# Patient Record
Sex: Female | Born: 1975 | Race: White | Hispanic: No | Marital: Single | Smoking: Former smoker
Health system: Southern US, Community
[De-identification: ages and names within clinical notes are randomized; demographics above are authoritative.]

## PROBLEM LIST (undated history)

## (undated) DIAGNOSIS — R51 Headache: Secondary | ICD-10-CM

## (undated) DIAGNOSIS — T8859XA Other complications of anesthesia, initial encounter: Secondary | ICD-10-CM

## (undated) DIAGNOSIS — T4145XA Adverse effect of unspecified anesthetic, initial encounter: Secondary | ICD-10-CM

## (undated) DIAGNOSIS — J45909 Unspecified asthma, uncomplicated: Secondary | ICD-10-CM

## (undated) DIAGNOSIS — R519 Headache, unspecified: Secondary | ICD-10-CM

## (undated) DIAGNOSIS — A630 Anogenital (venereal) warts: Secondary | ICD-10-CM

## (undated) DIAGNOSIS — Z8619 Personal history of other infectious and parasitic diseases: Secondary | ICD-10-CM

## (undated) DIAGNOSIS — T7840XA Allergy, unspecified, initial encounter: Secondary | ICD-10-CM

## (undated) HISTORY — DX: Allergy, unspecified, initial encounter: T78.40XA

## (undated) HISTORY — DX: Anogenital (venereal) warts: A63.0

## (undated) HISTORY — DX: Unspecified asthma, uncomplicated: J45.909

## (undated) HISTORY — DX: Headache, unspecified: R51.9

## (undated) HISTORY — DX: Personal history of other infectious and parasitic diseases: Z86.19

## (undated) HISTORY — PX: WISDOM TOOTH EXTRACTION: SHX21

## (undated) HISTORY — DX: Headache: R51

---
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
## 2016-09-12 DIAGNOSIS — Z1231 Encounter for screening mammogram for malignant neoplasm of breast: Secondary | ICD-10-CM | POA: Diagnosis not present

## 2016-09-12 DIAGNOSIS — Z01419 Encounter for gynecological examination (general) (routine) without abnormal findings: Secondary | ICD-10-CM | POA: Diagnosis not present

## 2016-09-12 DIAGNOSIS — Z30432 Encounter for removal of intrauterine contraceptive device: Secondary | ICD-10-CM | POA: Diagnosis not present

## 2016-09-12 DIAGNOSIS — Z113 Encounter for screening for infections with a predominantly sexual mode of transmission: Secondary | ICD-10-CM | POA: Diagnosis not present

## 2016-09-12 DIAGNOSIS — Z1151 Encounter for screening for human papillomavirus (HPV): Secondary | ICD-10-CM | POA: Diagnosis not present

## 2016-09-12 DIAGNOSIS — Z6826 Body mass index (BMI) 26.0-26.9, adult: Secondary | ICD-10-CM | POA: Diagnosis not present

## 2017-03-06 DIAGNOSIS — Z3202 Encounter for pregnancy test, result negative: Secondary | ICD-10-CM | POA: Diagnosis not present

## 2017-03-22 DIAGNOSIS — N915 Oligomenorrhea, unspecified: Secondary | ICD-10-CM | POA: Diagnosis not present

## 2017-03-24 DIAGNOSIS — E229 Hyperfunction of pituitary gland, unspecified: Secondary | ICD-10-CM | POA: Diagnosis not present

## 2017-04-11 ENCOUNTER — Encounter: Payer: Self-pay | Admitting: Family Medicine

## 2017-04-11 ENCOUNTER — Ambulatory Visit (INDEPENDENT_AMBULATORY_CARE_PROVIDER_SITE_OTHER): Payer: BLUE CROSS/BLUE SHIELD | Admitting: Family Medicine

## 2017-04-11 VITALS — BP 100/80 | HR 70 | Temp 98.4°F | Ht 62.25 in | Wt 149.7 lb

## 2017-04-11 DIAGNOSIS — Z6827 Body mass index (BMI) 27.0-27.9, adult: Secondary | ICD-10-CM

## 2017-04-11 DIAGNOSIS — D649 Anemia, unspecified: Secondary | ICD-10-CM

## 2017-04-11 DIAGNOSIS — Z0000 Encounter for general adult medical examination without abnormal findings: Secondary | ICD-10-CM

## 2017-04-11 DIAGNOSIS — M222X2 Patellofemoral disorders, left knee: Secondary | ICD-10-CM

## 2017-04-11 DIAGNOSIS — R7989 Other specified abnormal findings of blood chemistry: Secondary | ICD-10-CM

## 2017-04-11 DIAGNOSIS — N938 Other specified abnormal uterine and vaginal bleeding: Secondary | ICD-10-CM

## 2017-04-11 DIAGNOSIS — E229 Hyperfunction of pituitary gland, unspecified: Secondary | ICD-10-CM | POA: Diagnosis not present

## 2017-04-11 NOTE — Patient Instructions (Signed)
BEFORE YOU LEAVE: -labs -PFS exercises -follow up: 2-3 months  -We placed a referral for you as discussed for the MRI and to the endocrinologist. It usually takes about 1-2 weeks to process and schedule this referral. If you have not heard from [place] regarding this appointment in 2 weeks please contact our office.  -Do the exercises for the knee 4 days per week   We recommend the following healthy lifestyle for LIFE: 1) Small portions. Regular healthy meals.  2) Eat a healthy clean diet.   TRY TO EAT: -at least 5-7 servings of low sugar vegetables per day (not corn, potatoes or bananas.) -berries are the best choice if you wish to eat fruit.   -lean meets (fish, chicken or [place] breasts) -vegan proteins for some meals - beans or tofu, whole grains, nuts and seeds -Replace bad fats with good fats - good fats include: fish, nuts and seeds, canola oil, olive oil -small amounts of low fat or non fat dairy -small amounts of100 % whole grains - check the lables  AVOID: -SUGAR, sweets, anything with added sugar, corn syrup or sweeteners -if you must have a sweetener, small amounts of stevia may be best -sweetened beverages -simple starches (rice, bread, potatoes, pasta, chips, etc - small amounts of 100% whole grains are ok) -red meat, pork, butter -fried foods, fast food, processed food, excessive dairy, eggs and coconut.  3)Get at least 150 minutes of sweaty aerobic exercise per week.  4)Reduce stress - consider counseling, meditation and relaxation to balance other aspects of your life.

## 2017-04-11 NOTE — Progress Notes (Signed)
HPI:  Linda Barnett is here to establish care.  Last PCP and physical: has been seeing Dr. Garwin Brothers in gyn for women's health, fertility and DUB  Has the following chronic problems that require follow up and concerns today:  Elevated prolactin: -on labs with Dr. Garwin Brothers for DUB (missed one period, trying to conceive) -reports level was repeated then she was told to take cabergoline -no MRI or other labs except TSH done -she wants to have MRI and figure out options in terms of cause and treatment -other then DUB feels well, has had frequent headaches since childhood- unchanged -no breast discharge, fatigue, palpitations, hot/cold intol or any other symptoms -missed one period then had period -no meds -take MV, drinks alcohol and smokes pot on regular basis -hx tobacco use remotely -not doing high protein diet at time of elevated levels  Elevated BMI: -trying ketogenic diet - started after elevated prolactin  L knee pain and crepitus: -chronic, some in R knee also -active, walks with dog on regular basis -sometime pain L later upper thigh as well -no weakness, numbness, malaise, fevers, giving away, locking  ROS negative for unless reported above: fevers, unintentional weight loss, hearing or vision loss, chest pain, palpitations, struggling to breath, hemoptysis, melena, hematochezia, hematuria, falls, loc, si, thoughts of self harm  Past Medical History:  Diagnosis Date  . Allergy   . Asthma   . Frequent headaches   . Genital warts   . History of chicken pox     No past surgical history on file.  Family History  Problem Relation Age of Onset  . Hypothyroidism Mother   . Alcohol abuse Father   . Arthritis Maternal Grandmother   . Breast cancer Maternal Grandmother   . Diabetes Maternal Grandmother     Social History   Social History  . Marital status: Single    Spouse name: N/A  . Number of children: N/A  . Years of education: N/A   Social History Main  Topics  . Smoking status: Former Research scientist (life sciences)  . Smokeless tobacco: Former Systems developer  . Alcohol use Yes     Comment: 2-3 drinks   . Drug use: Yes    Types: Marijuana     Comment: regular use  . Sexual activity: Yes   Other Topics Concern  . None   Social History Narrative  . None    No current outpatient prescriptions on file.  EXAM:  Vitals:   04/11/17 1503  BP: 100/80  Pulse: 70  Temp: 98.4 F (36.9 C)    Body mass index is 27.16 kg/m.  GENERAL: vitals reviewed and listed above, alert, oriented, appears well hydrated and in no acute distress  HEENT: atraumatic, conjunttiva clear, no obvious abnormalities on inspection of external nose and ears  NECK: no obvious masses on inspection  LUNGS: clear to auscultation bilaterally, no wheezes, rales or rhonchi, good air movement  CV: HRRR, no peripheral edema  MS: moves all extremities without noticeable abnormality, normal gait, J sign and patella crepitus with VM atrophy L > R, normal knee exam otherwise  PSYCH: pleasant and cooperative, no obvious depression or anxiety  ASSESSMENT AND PLAN:  Discussed the following assessment and plan:  Elevated prolactin level (HCC) - Plan: MR Brain W Wo Contrast, Basic metabolic panel, CBC DUB (dysfunctional uterine bleeding) -we discussed possible serious and likely etiologies, workup and treatment, treatment risks and return precautions -after this discussion, Sol opted for MRI and referral to endocrinology , labs per above -  reviewed outside labs and TSH normal, prolactin 32 -advised to stop MV and pot -of course, we advised Jolleen  to return or notify a doctor immediately if symptoms worsen or persist or new concerns arise.  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 -We provided refills for any medications we will prescribe as needed. -We addressed current concerns per orders and patient instructions. -We have asked for records for pertinent exams, studies, vaccines and notes from previous providers. -We have advised patient to follow up per instructions below.   -Patient advised to return or notify a doctor immediately if symptoms worsen or persist or new concerns arise. Also follow up in 2-3 months.  Patient Instructions  BEFORE YOU LEAVE: -labs -PFS exercises -follow up: 2-3 months  -We placed a referral for you as discussed for the MRI and to the endocrinologist. It usually takes about 1-2 weeks to process and schedule this referral. If you have not heard from [place] regarding this appointment in 2 weeks please contact our office.  -Do the exercises for the knee 4 days per week   We recommend the following healthy lifestyle for LIFE: 1) Small portions. Regular healthy meals.  2) Eat a healthy clean diet.   TRY TO EAT: -at least 5-7 servings of low sugar vegetables per day (not corn, potatoes or bananas.) -berries are the best choice if you wish to eat fruit.   -lean meets (fish, chicken or [place] breasts) -vegan proteins for some meals - beans or tofu, whole grains, nuts and seeds -Replace bad fats with good fats - good fats include: fish, nuts and seeds, canola oil, olive oil -small amounts of low fat or non fat dairy -small amounts of100 % whole grains - check the lables  AVOID: -SUGAR, sweets, anything with added sugar, corn syrup or sweeteners -if you must have a sweetener, small amounts of stevia may be best -sweetened beverages -simple starches (rice, bread, potatoes, pasta, chips, etc - small amounts of 100% whole grains are ok) -red meat, pork, butter -fried foods, fast food,  processed food, excessive dairy, eggs and coconut.  3)Get at least 150 minutes of sweaty aerobic exercise per week.  4)Reduce stress - consider counseling, meditation and relaxation to balance other aspects of your life.     Colin Benton R.

## 2017-04-12 LAB — BASIC METABOLIC PANEL
BUN: 17 mg/dL (ref 6–23)
CALCIUM: 9.6 mg/dL (ref 8.4–10.5)
CO2: 26 meq/L (ref 19–32)
Chloride: 102 mEq/L (ref 96–112)
Creatinine, Ser: 0.66 mg/dL (ref 0.40–1.20)
GFR: 103.79 mL/min (ref >60.00)
Glucose, Bld: 85 mg/dL (ref 70–99)
Potassium: 4.1 mEq/L (ref 3.5–5.1)
Sodium: 136 mEq/L (ref 135–145)

## 2017-04-12 LAB — HDL CHOLESTEROL: HDL: 64.8 mg/dL (ref >39.00)

## 2017-04-12 LAB — CHOLESTEROL, TOTAL: CHOLESTEROL: 214 mg/dL — AB (ref 0–200)

## 2017-04-12 LAB — CBC
HCT: 35.7 % — ABNORMAL LOW (ref 36.0–46.0)
Hemoglobin: 11.8 g/dL — ABNORMAL LOW (ref 12.0–15.0)
MCHC: 33.1 g/dL (ref 30.0–36.0)
MCV: 89.5 fl (ref 78.0–100.0)
Platelets: 288 10*3/uL (ref 150.0–400.0)
RBC: 3.99 Mil/uL (ref 3.87–5.11)
RDW: 13.3 % (ref 11.5–15.5)
WBC: 8 10*3/uL (ref 4.0–10.5)

## 2017-04-12 LAB — HEMOGLOBIN A1C: HEMOGLOBIN A1C: 5.8 % (ref 4.6–6.5)

## 2017-04-26 ENCOUNTER — Telehealth: Payer: Self-pay | Admitting: Family Medicine

## 2017-04-26 NOTE — Telephone Encounter (Signed)
Linda Barnett pt returned your call

## 2017-04-27 NOTE — Addendum Note (Signed)
Addended by: Agnes Lawrence on: 04/27/2017 04:33 PM   Modules accepted: Orders

## 2017-04-27 NOTE — Telephone Encounter (Signed)
See results note. 

## 2017-05-01 ENCOUNTER — Ambulatory Visit
Admission: RE | Admit: 2017-05-01 | Discharge: 2017-05-01 | Disposition: A | Discharge status: Home / Self Care | Payer: BLUE CROSS/BLUE SHIELD | Source: Ambulatory Visit | Attending: Family Medicine | Admitting: Family Medicine

## 2017-05-01 DIAGNOSIS — R7989 Other specified abnormal findings of blood chemistry: Secondary | ICD-10-CM

## 2017-05-01 DIAGNOSIS — E221 Hyperprolactinemia: Secondary | ICD-10-CM | POA: Diagnosis not present

## 2017-05-01 DIAGNOSIS — E229 Hyperfunction of pituitary gland, unspecified: Principal | ICD-10-CM

## 2017-05-01 MED ORDER — GADOBENATE DIMEGLUMINE 529 MG/ML IV SOLN
10.0000 mL | Freq: Once | INTRAVENOUS | Status: AC | PRN
  Administered 2017-05-01: 10 mL via INTRAVENOUS

## 2017-05-04 ENCOUNTER — Other Ambulatory Visit: Payer: Self-pay | Admitting: *Deleted

## 2017-05-04 DIAGNOSIS — E229 Hyperfunction of pituitary gland, unspecified: Principal | ICD-10-CM

## 2017-05-04 DIAGNOSIS — R7989 Other specified abnormal findings of blood chemistry: Secondary | ICD-10-CM

## 2017-05-08 NOTE — Progress Notes (Signed)
Patient ID: Linda Barnett, female   DOB: [birth year], 41 y.o.   MRN: [id number]         Referring physician: Dr. Colin Benton  Chief complaint: Menstrual irregularity  History of Present Illness  She has had a missed menstrual cycle in May 2018 prior to her visit  with her gynecologist in June Her prolactin was tested and was increased in 74s Because of her potential desire for pregnancy she was recommended cabergoline  However patient was not wanting to start this and discussed the problem with her PCP She did have a menstrual cycle in June which was relatively lighter than usual but her cycle was normal in July and she is due to have a menstrual cycle and about 2 or 3 days now She normally has menstrual cycles about 27-28 days apart  She does not complain of any milky breast discharge She does not complain of any unusual headaches or change in vision  Prolactin levels:  Done in June: 37.6 and 32 within a few days apart, the second level was done fasting in the morning  No results found for: PROLACTIN   MRI of pituitary gland shows no pituitary abnormality   She is now referred here for further management  She is currently undecided whether she wants to go for a pregnancy considering her age    Allergies as of 05/09/2017   No Known Allergies     Medication List       Accurate as of 05/09/17 11:32 AM. Always use your most recent med list.          cabergoline 0.5 MG tablet Commonly known as:  DOSTINEX   CENTRUM WOMEN PO Take 1 tablet by mouth daily.       Allergies: No Known Allergies  Past Medical History:  Diagnosis Date  . Allergy   . Asthma   . Frequent headaches   . Genital warts   . History of chicken pox     No past surgical history on file.  Family History  Problem Relation Age of Onset  . Hypothyroidism Mother   . Alcohol abuse Father   . Arthritis Maternal Grandmother   . Breast cancer Maternal Grandmother   . Diabetes Maternal  Grandmother     Social History:  reports that she has quit smoking. She has never used smokeless tobacco. She reports that she drinks alcohol. She reports that she uses drugs, including Marijuana.   Review of Systems  Constitutional: Positive for weight gain.       She had gained 10 pounds over the last year  HENT:       She has periodic headaches, not changed recently  Eyes: Negative for blurred vision.  Endocrine: Positive for menstrual changes. Negative for fatigue.  Skin:       She has mild chronic hair loss, nothing significant     EXAM:  BP 122/76   Pulse 71   Ht 5' 2.75" (1.594 m)   Wt 149 lb 3.2 oz (67.7 kg)   SpO2 98%   BMI 26.64 kg/m   Physical Exam  Constitutional: She appears well-developed and well-nourished.  Neck: No thyromegaly present.  Cardiovascular: Normal rate, regular rhythm and normal heart sounds.   Pulmonary/Chest: Breath sounds normal.  Abdominal:  Exam not indicated  Lymphadenopathy:    She has no cervical adenopathy.  Neurological: She displays normal reflexes.  Biceps reflexes are normal  Skin: Skin is warm. No pallor.   No history of thyroid  disease.  TSH done in June was 1.8 from gynecologist  ASSESSMENT:  HYPERPROLACTINEMIA, mild and not associated with a documented pituitary adenoma She apparently had only one missed cycles and a lighter cycles subsequently in June but the last menstrual cycle was normal  Not clear if her hyperprolactinemia was related to only a transient dysfunction of her pituitary gland She does not have any other signs symptoms related to hypopituitarism  PLAN:  Recheck prolactin level today and decide on further management or follow-up Since she is asymptomatic currently she does not need to be on medication  Consultation note sent back to PCP  [facility] 05/09/17

## 2017-05-09 ENCOUNTER — Encounter: Payer: Self-pay | Admitting: Endocrinology

## 2017-05-09 ENCOUNTER — Ambulatory Visit (INDEPENDENT_AMBULATORY_CARE_PROVIDER_SITE_OTHER): Payer: BLUE CROSS/BLUE SHIELD | Admitting: Endocrinology

## 2017-05-09 ENCOUNTER — Ambulatory Visit: Payer: BLUE CROSS/BLUE SHIELD | Admitting: Endocrinology

## 2017-05-09 DIAGNOSIS — E221 Hyperprolactinemia: Secondary | ICD-10-CM | POA: Insufficient documentation

## 2017-05-10 LAB — PROLACTIN: Prolactin: 33.6 ng/mL — ABNORMAL HIGH (ref 4.8–23.3)

## 2017-05-10 NOTE — Progress Notes (Signed)
Please call to let patient know that the prolactin level is about the same at 34.  Will need to consider starting her medication if she is planning a pregnancy otherwise no treatment Follow-up in 6 months

## 2017-06-08 NOTE — Progress Notes (Deleted)
  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  Past Medical History:  Diagnosis Date  . Allergy   . Asthma   . Frequent headaches   . Genital warts   . History of chicken pox     No past surgical history on file.  Family History  Problem Relation Age of Onset  . Hypothyroidism Mother   . Alcohol abuse Father   . Arthritis Maternal Grandmother   . Breast cancer Maternal Grandmother   . Diabetes Maternal Grandmother     Social History   Social History  . Marital status: Single    Spouse name: N/A  . Number of children: N/A  . Years of education: N/A   Social History Main Topics  . Smoking status: Former Research scientist (life sciences)  . Smokeless tobacco: Never Used  . Alcohol use Yes     Comment: 2-3 drinks   . Drug use: Yes    Types: Marijuana     Comment: regular use  . Sexual activity: Yes   Other Topics Concern  . Not on file   Social History Narrative   Work or School: Therapist, sports      Home Situation: lives with partner - female Biochemist, clinical)      Spiritual Beliefs:       Lifestyle: ketogenic diet, walking        Current Outpatient Prescriptions:  .  cabergoline (DOSTINEX) 0.5 MG tablet, , Disp: , Rfl: 0 .  Multiple Vitamins-Minerals (CENTRUM WOMEN PO), Take 1 tablet by mouth daily., Disp: , Rfl:   EXAM:  There were no vitals filed for this visit.  There is no height or weight on file to calculate BMI.  GENERAL: vitals reviewed and listed above, alert, oriented, appears well hydrated and in no acute distress  HEENT: atraumatic, conjunttiva clear, no obvious abnormalities on inspection of external nose and ears  NECK: no obvious masses on inspection  LUNGS: clear to  auscultation bilaterally, no wheezes, rales or rhonchi, good air movement  CV: HRRR, no peripheral edema  MS: moves all extremities without noticeable abnormality  PSYCH: pleasant and cooperative, no obvious depression or anxiety  ASSESSMENT AND PLAN:  Discussed the following assessment and plan:  No diagnosis found.  -Patient advised to return or notify a doctor immediately if symptoms worsen or persist or new concerns arise.  There are no Patient Instructions on file for this visit.  Colin Benton R., DO

## 2017-06-09 ENCOUNTER — Encounter: Payer: Self-pay | Admitting: Family Medicine

## 2017-06-09 ENCOUNTER — Ambulatory Visit (INDEPENDENT_AMBULATORY_CARE_PROVIDER_SITE_OTHER): Payer: BLUE CROSS/BLUE SHIELD | Admitting: Family Medicine

## 2017-06-09 ENCOUNTER — Ambulatory Visit: Payer: BLUE CROSS/BLUE SHIELD | Admitting: Family Medicine

## 2017-06-09 VITALS — BP 100/60 | HR 68 | Temp 98.3°F | Ht 62.25 in | Wt 145.0 lb

## 2017-06-09 DIAGNOSIS — Z23 Encounter for immunization: Secondary | ICD-10-CM

## 2017-06-09 DIAGNOSIS — D649 Anemia, unspecified: Secondary | ICD-10-CM | POA: Insufficient documentation

## 2017-06-09 DIAGNOSIS — E785 Hyperlipidemia, unspecified: Secondary | ICD-10-CM

## 2017-06-09 DIAGNOSIS — Z6826 Body mass index (BMI) 26.0-26.9, adult: Secondary | ICD-10-CM | POA: Diagnosis not present

## 2017-06-09 DIAGNOSIS — R739 Hyperglycemia, unspecified: Secondary | ICD-10-CM | POA: Diagnosis not present

## 2017-06-09 NOTE — Progress Notes (Signed)
  HPI:  Follow up elevated BMI, prediabetes, PFS, dyslipidemia, mild anemia and elevated prolactin. Saw endo and on cabergoline. Doing well. Continues low sugar diet and regular exercise and BMI approaching normal. Knee pain resolved. Getting flu shot today. Will complete stool cards. Denies bleeding, knee pain, complaints.  ROS: See pertinent positives and negatives per HPI.  Past Medical History:  Diagnosis Date  . Allergy   . Asthma   . Frequent headaches   . Genital warts   . History of chicken pox     No past surgical history on file.  Family History  Problem Relation Age of Onset  . Hypothyroidism Mother   . Alcohol abuse Father   . Arthritis Maternal Grandmother   . Breast cancer Maternal Grandmother   . Diabetes Maternal Grandmother     Social History   Social History  . Marital status: Single    Spouse name: N/A  . Number of children: N/A  . Years of education: N/A   Social History Main Topics  . Smoking status: Former Research scientist (life sciences)  . Smokeless tobacco: Never Used  . Alcohol use Yes     Comment: 2-3 drinks   . Drug use: Yes    Types: Marijuana     Comment: regular use  . Sexual activity: Yes   Other Topics Concern  . None   Social History Narrative   Work or School: Engineer, maintenance (IT) Situation: lives with partner - female Biochemist, clinical)      Spiritual Beliefs:       Lifestyle: ketogenic diet, walking        Current Outpatient Prescriptions:  .  cabergoline (DOSTINEX) 0.5 MG tablet, , Disp: , Rfl: 0 .  Multiple Vitamins-Minerals (CENTRUM WOMEN PO), Take 1 tablet by mouth daily., Disp: , Rfl:   EXAM:  Vitals:   06/09/17 0943  BP: 100/60  Pulse: 68  Temp: 98.3 F (36.8 C)    Body mass index is 26.31 kg/m.  GENERAL: vitals reviewed and listed above, alert, oriented, appears well hydrated and in no acute distress  HEENT: atraumatic, conjunttiva clear, no obvious abnormalities on inspection of external nose and ears  NECK: no  obvious masses on inspection  LUNGS: clear to auscultation bilaterally, no wheezes, rales or rhonchi, good air movement  CV: HRRR, no peripheral edema  MS: moves all extremities without noticeable abnormality  PSYCH: pleasant and cooperative, no obvious depression or anxiety  ASSESSMENT AND PLAN:  Discussed the following assessment and plan:  BMI 26.0-26.9,adult  Hyperglycemia  Hyperlipidemia, unspecified hyperlipidemia type  Anemia, unspecified type  -flu shot -continue healthy diet and exercise - congratulated on success -stool cards and repeat cbc in 2 months -Patient advised to return or notify a doctor immediately if symptoms worsen or persist or new concerns arise.  Patient Instructions  BEFORE YOU LEAVE: -flu shot -advise to complete stool cards for anemia, ensure she has the cards -follow up: 1) lab appointment for cbc in 2 months 2)CPE with fasting labs 03/2018       Colin Benton R., DO

## 2017-06-09 NOTE — Patient Instructions (Addendum)
BEFORE YOU LEAVE: -flu shot -advise to complete stool cards for anemia, ensure she has the cards -follow up: 1) lab appointment for cbc in 2 months 2)CPE with fasting labs 03/2018

## 2017-07-14 DIAGNOSIS — S90411A Abrasion, right great toe, initial encounter: Secondary | ICD-10-CM | POA: Diagnosis not present

## 2017-07-14 DIAGNOSIS — Z23 Encounter for immunization: Secondary | ICD-10-CM | POA: Diagnosis not present

## 2017-07-26 DIAGNOSIS — F4321 Adjustment disorder with depressed mood: Secondary | ICD-10-CM | POA: Diagnosis not present

## 2017-08-09 ENCOUNTER — Other Ambulatory Visit (INDEPENDENT_AMBULATORY_CARE_PROVIDER_SITE_OTHER): Payer: BLUE CROSS/BLUE SHIELD

## 2017-08-09 DIAGNOSIS — D649 Anemia, unspecified: Secondary | ICD-10-CM

## 2017-08-09 DIAGNOSIS — F4321 Adjustment disorder with depressed mood: Secondary | ICD-10-CM | POA: Diagnosis not present

## 2017-08-10 LAB — CBC WITH DIFFERENTIAL/PLATELET
BASOS ABS: 0.1 10*3/uL (ref 0.0–0.1)
BASOS PCT: 2 % (ref 0.0–3.0)
EOS ABS: 0.2 10*3/uL (ref 0.0–0.7)
Eosinophils Relative: 2.5 % (ref 0.0–5.0)
HCT: 36.6 % (ref 36.0–46.0)
HEMOGLOBIN: 12 g/dL (ref 12.0–15.0)
Lymphocytes Relative: 32.3 % (ref 12.0–46.0)
Lymphs Abs: 2.2 10*3/uL (ref 0.7–4.0)
MCHC: 32.8 g/dL (ref 30.0–36.0)
MCV: 89 fl (ref 78.0–100.0)
MONO ABS: 0.5 10*3/uL (ref 0.1–1.0)
Monocytes Relative: 7.3 % (ref 3.0–12.0)
NEUTROS PCT: 55.9 % (ref 43.0–77.0)
Neutro Abs: 3.7 10*3/uL (ref 1.4–7.7)
PLATELETS: 286 10*3/uL (ref 150.0–400.0)
RBC: 4.11 Mil/uL (ref 3.87–5.11)
RDW: 14 % (ref 11.5–15.5)
WBC: 6.7 10*3/uL (ref 4.0–10.5)

## 2017-08-24 DIAGNOSIS — F4321 Adjustment disorder with depressed mood: Secondary | ICD-10-CM | POA: Diagnosis not present

## 2017-10-03 DIAGNOSIS — F4321 Adjustment disorder with depressed mood: Secondary | ICD-10-CM | POA: Diagnosis not present

## 2017-10-18 DIAGNOSIS — F4321 Adjustment disorder with depressed mood: Secondary | ICD-10-CM | POA: Diagnosis not present

## 2017-11-08 ENCOUNTER — Ambulatory Visit: Payer: BLUE CROSS/BLUE SHIELD | Admitting: Endocrinology

## 2017-11-09 ENCOUNTER — Encounter: Payer: BLUE CROSS/BLUE SHIELD | Admitting: Endocrinology

## 2017-11-09 ENCOUNTER — Other Ambulatory Visit: Payer: BLUE CROSS/BLUE SHIELD

## 2017-11-09 DIAGNOSIS — E221 Hyperprolactinemia: Secondary | ICD-10-CM

## 2017-11-10 LAB — PROLACTIN: Prolactin: 49.9 ng/mL — ABNORMAL HIGH (ref 4.8–23.3)

## 2017-11-10 NOTE — Progress Notes (Signed)
This encounter was created in error - please disregard.

## 2017-11-13 ENCOUNTER — Ambulatory Visit: Payer: BLUE CROSS/BLUE SHIELD | Admitting: Endocrinology

## 2017-11-13 ENCOUNTER — Encounter: Payer: Self-pay | Admitting: Endocrinology

## 2017-11-13 VITALS — BP 112/71 | HR 81 | Temp 98.1°F | Resp 16 | Ht 63.0 in | Wt 148.0 lb

## 2017-11-13 DIAGNOSIS — E221 Hyperprolactinemia: Secondary | ICD-10-CM | POA: Diagnosis not present

## 2017-11-13 NOTE — Progress Notes (Signed)
Patient ID: Linda Barnett, female   DOB: December 16, 1975, 42 y.o.   MRN: [id number]         Referring physician: Dr. Colin Benton  Chief complaint:    Menstrual irregularity  History of Present Illness  She has had a missed menstrual cycle in May 2018 prior to her visit  with her gynecologist in June Her prolactin was tested and was increased in 36s Because of her potential desire for pregnancy she was recommended cabergoline In 04/2017 However patient was not wanting to start this and discussed the problem with her PCP  She did have a menstrual cycle in June 2018 which was relatively lighter than usual but her cycle was normal in July  Since her last visit her menstrual cycles have been normal in duration and frequency  She normally has menstrual cycles about 27-28 days apart  She does not complain of any milky breast discharge  She does not complain of any new headaches  Prolactin levels:  Done in June 2018: 37.6 and 32 within a few days apart, the second level was done fasting in the morning  Lab Results  Component Value Date   PROLACTIN 49.9 (H) 11/09/2017   PROLACTIN 33.6 (H) 05/09/2017     MRI of pituitary gland in 04/2017 shows no pituitary abnormality   She is still undecided whether she wants to go for a pregnancy    Allergies as of 11/13/2017   No Known Allergies     Medication List        Accurate as of 11/13/17  5:04 PM. Always use your most recent med list.          CENTRUM WOMEN PO Take 1 tablet by mouth daily.       Allergies: No Known Allergies  Past Medical History:  Diagnosis Date  . Allergy   . Asthma   . Frequent headaches   . Genital warts   . History of chicken pox     History reviewed. No pertinent surgical history.  Family History  Problem Relation Age of Onset  . Hypothyroidism Mother   . Alcohol abuse Father   . Arthritis Maternal Grandmother   . Breast cancer Maternal Grandmother   . Diabetes Maternal Grandmother      Social History:  reports that she has quit smoking. she has never used smokeless tobacco. She reports that she drinks alcohol. She reports that she uses drugs. Drug: Marijuana.   Review of Systems   EXAM:  BP 112/71 (BP Location: Left Arm, Patient Position: Sitting, Cuff Size: Small)   Pulse 81   Temp 98.1 F (36.7 C) (Oral)   Resp 16   Ht 5' 3"  (1.6 m)   Wt 148 lb (67.1 kg)   SpO2 99%   BMI 26.22 kg/m   Physical Exam   No history of thyroid disease.    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

## 2017-11-29 DIAGNOSIS — F4321 Adjustment disorder with depressed mood: Secondary | ICD-10-CM | POA: Diagnosis not present

## 2017-12-11 ENCOUNTER — Other Ambulatory Visit: Payer: BLUE CROSS/BLUE SHIELD

## 2017-12-11 DIAGNOSIS — E221 Hyperprolactinemia: Secondary | ICD-10-CM

## 2017-12-12 LAB — PROLACTIN: PROLACTIN: 44.9 ng/mL — AB

## 2017-12-13 DIAGNOSIS — F4321 Adjustment disorder with depressed mood: Secondary | ICD-10-CM | POA: Diagnosis not present

## 2017-12-18 NOTE — Progress Notes (Signed)
Please call to let patient know that the prolactin test is slightly better at 45, if she is having no issues with menstrual cycles no intervention needed

## 2017-12-26 DIAGNOSIS — F4321 Adjustment disorder with depressed mood: Secondary | ICD-10-CM | POA: Diagnosis not present

## 2018-01-10 DIAGNOSIS — F4321 Adjustment disorder with depressed mood: Secondary | ICD-10-CM | POA: Diagnosis not present

## 2018-01-24 DIAGNOSIS — F4321 Adjustment disorder with depressed mood: Secondary | ICD-10-CM | POA: Diagnosis not present

## 2018-02-07 DIAGNOSIS — F4321 Adjustment disorder with depressed mood: Secondary | ICD-10-CM | POA: Diagnosis not present

## 2018-03-01 DIAGNOSIS — F4321 Adjustment disorder with depressed mood: Secondary | ICD-10-CM | POA: Diagnosis not present

## 2018-03-05 ENCOUNTER — Telehealth: Payer: Self-pay | Admitting: Endocrinology

## 2018-03-05 ENCOUNTER — Other Ambulatory Visit: Payer: Self-pay | Admitting: Endocrinology

## 2018-03-05 DIAGNOSIS — E221 Hyperprolactinemia: Secondary | ICD-10-CM

## 2018-03-05 NOTE — Telephone Encounter (Signed)
Patient has reschedule her appt to 6/24 and would like to know if Dr Dwyane Dee needs lab work done prior to visit. I did not see orders in for lab work and wanted to check before scheduling.  Thanks!

## 2018-03-05 NOTE — Telephone Encounter (Signed)
Please advise 

## 2018-03-05 NOTE — Telephone Encounter (Signed)
Could you please call and schedule this?

## 2018-03-05 NOTE — Telephone Encounter (Signed)
She will need fasting labs which are ordered

## 2018-03-13 ENCOUNTER — Ambulatory Visit: Payer: BLUE CROSS/BLUE SHIELD | Admitting: Endocrinology

## 2018-03-16 ENCOUNTER — Other Ambulatory Visit: Payer: BLUE CROSS/BLUE SHIELD

## 2018-03-16 DIAGNOSIS — E221 Hyperprolactinemia: Secondary | ICD-10-CM | POA: Diagnosis not present

## 2018-03-17 LAB — PROLACTIN: Prolactin: 57.1 ng/mL — ABNORMAL HIGH (ref 4.8–23.3)

## 2018-03-18 NOTE — Progress Notes (Signed)
Patient ID: Linda Barnett, female   DOB: 1976/03/12, 42 y.o.   MRN: [id number]           Chief complaint: Follow-up of high prolactin   History of Present Illness  Background history: She had a missed menstrual cycle in May 2018 prior to her visit  with her gynecologist  Her prolactin was tested and was increased in 30s Because of her potential desire for pregnancy she was recommended cabergoline in 04/2017 However patient was not wanting to start this and may have taken only 1 tablet  She did have fairly regular menstrual cycles on her own subsequently  Previous prolactin levels: June 2018: 37.6 and 32 within a few days apart, the second level was done fasting in the morning  Recent history: Her menstrual cycle in June 2019 is late by 10 days, normally about the middle of the month  However she has had regular cycles since her last visit in 2/19 except for being 2 days late last month She normally has menstrual cycles about 27-28 days apart  She does not complain of any milky breast discharge  She does not complain of any new headaches  However her prolactin level which was done from [facility] is higher now  Prolactin levels:   Lab Results  Component Value Date   PROLACTIN 57.1 (H) 03/16/2018   PROLACTIN 44.9 (H) 12/11/2017   PROLACTIN 49.9 (H) 11/09/2017   PROLACTIN 33.6 (H) 05/09/2017     MRI of pituitary gland in 04/2017 shows no pituitary abnormality  No results found for: TSH, FREET4   Allergies as of 03/19/2018   No Known Allergies     Medication List    as of 03/19/2018 10:13 AM   You have not been prescribed any medications.     Allergies: No Known Allergies  Past Medical History:  Diagnosis Date  . Allergy   . Asthma   . Frequent headaches   . Genital warts   . History of chicken pox     History reviewed. No pertinent surgical history.  Family History  Problem Relation Age of Onset  . Hypothyroidism Mother   . Alcohol abuse Father   .  Arthritis Maternal Grandmother   . Breast cancer Maternal Grandmother   . Diabetes Maternal Grandmother     Social History:  reports that she has quit smoking. She has never used smokeless tobacco. She reports that she drinks alcohol. She reports that she has current or past drug history. Drug: Marijuana.   Review of Systems   EXAM:  BP 102/68 (BP Location: Left Arm, Patient Position: Sitting, Cuff Size: Normal)   Pulse 75   Ht 5' 3"  (1.6 m)   Wt 134 lb 12.8 oz (61.1 kg)   SpO2 93%   BMI 23.88 kg/m   Physical Exam   No history of thyroid disease.    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 with no underlying cause or medication use She has had delayed menstrual cycles but mostly this month Currently not desiring pregnancy Discussed effects of persistent prolactin on decreasing estradiol levels especially if her menstrual cycles are not regular  PLAN:  May consider starting cabergoline if she continues to have late menstrual cycles or skips her cycle completely She will call back later this month or early next month to report her progress  Recheck prolactin level in about 3 months and also check for macro-prolactin We will also check estradiol levels as baseline     Elayne Snare 03/19/18

## 2018-03-19 ENCOUNTER — Ambulatory Visit: Payer: BLUE CROSS/BLUE SHIELD | Admitting: Endocrinology

## 2018-03-19 ENCOUNTER — Encounter: Payer: Self-pay | Admitting: Endocrinology

## 2018-03-19 VITALS — BP 102/68 | HR 75 | Ht 63.0 in | Wt 134.8 lb

## 2018-03-19 DIAGNOSIS — E221 Hyperprolactinemia: Secondary | ICD-10-CM | POA: Diagnosis not present

## 2018-03-19 DIAGNOSIS — N914 Secondary oligomenorrhea: Secondary | ICD-10-CM | POA: Diagnosis not present

## 2018-03-19 NOTE — Patient Instructions (Signed)
Call to report in July

## 2018-03-21 DIAGNOSIS — F4321 Adjustment disorder with depressed mood: Secondary | ICD-10-CM | POA: Diagnosis not present

## 2018-04-07 NOTE — Progress Notes (Signed)
HPI:  Using dictation device. Unfortunately this device frequently misinterprets words/phrases.  Here for CPE: Due for tetanus booster.  -Concerns and/or follow up today:   Sees Dr. Dwyane Dee for hyperprolactinemia. MRI in 2018 ok per notes. No medications currently. Sees gyn. Reports is doing well. No complaints today.  Hx mild hyperlipidemia and mild prediabetes. Getting regular exercise and eating healthy.  -Taking folic acid, vitamin D or calcium: no - MV sometimes -Diabetes and Dyslipidemia Screening: fasting for labs -Vaccines: see vaccine section EPIC -pap history: sees gyn, Dr. Garwin Brothers -FDLMP: see nursing notes -sexual activity: yes, female partner, no new partners -wants STI testing (Hep C if born [birth year]): no -FH breast, colon or ovarian ca: see FH Last mammogram: sees Dr. Garwin Brothers for this Last colon cancer screening: n/a Breast Ca Risk Assessment: see family history and pt history DEXA (>/= 59): n/a  -Alcohol, Tobacco, drug use: see social history  Review of Systems - no reported fevers, unintentional weight loss, vision loss, hearing loss, chest pain, sob, hemoptysis, melena, hematochezia, hematuria, genital discharge, changing or concerning skin lesions, bleeding, bruising, loc, thoughts of self harm or SI  Past Medical History:  Diagnosis Date  . Allergy   . Asthma   . Frequent headaches   . Genital warts   . History of chicken pox     History reviewed. No pertinent surgical history.  Family History  Problem Relation Age of Onset  . Hypothyroidism Mother   . Alcohol abuse Father   . Arthritis Maternal Grandmother   . Breast cancer Maternal Grandmother   . Diabetes Maternal Grandmother     Social History   Socioeconomic History  . Marital status: Single    Spouse name: Not on file  . Number of children: Not on file  . Years of education: Not on file  . Highest education level: Not on file  Occupational History  . Not on file  Social Needs  .  Financial resource strain: Not on file  . Food insecurity:    Worry: Not on file    Inability: Not on file  . Transportation needs:    Medical: Not on file    Non-medical: Not on file  Tobacco Use  . Smoking status: Current Some Day Smoker  . Smokeless tobacco: Never Used  Substance and Sexual Activity  . Alcohol use: Yes    Comment: 2-3 drinks   . Drug use: Yes    Types: Marijuana    Comment: regular use  . Sexual activity: Yes  Lifestyle  . Physical activity:    Days per week: Not on file    Minutes per session: Not on file  . Stress: Not on file  Relationships  . Social connections:    Talks on phone: Not on file    Gets together: Not on file    Attends religious service: Not on file    Active member of club or organization: Not on file    Attends meetings of clubs or organizations: Not on file    Relationship status: Not on file  Other Topics Concern  . Not on file  Social History Narrative   Work or School: Therapist, sports      Home Situation: lives with partner - female Biochemist, clinical)      Spiritual Beliefs:       Lifestyle: ketogenic diet, walking    No current outpatient medications on file.  EXAM:  Vitals:   04/09/18 0825  BP: (!) 80/60  Pulse:  86  Temp: 98.1 F (36.7 C)    GENERAL: vitals reviewed and listed below, alert, oriented, appears well hydrated and in no acute distress  HEENT: head atraumatic, PERRLA, normal appearance of eyes, ears, nose and mouth. moist mucus membranes.  NECK: supple, no masses or lymphadenopathy  LUNGS: clear to auscultation bilaterally, no rales, rhonchi or wheeze  CV: HRRR, no peripheral edema or cyanosis, normal pedal pulses  ABDOMEN: bowel sounds normal, soft, non tender to palpation, no masses, no rebound or guarding  GU/BREAST: declined, sees Dr. Garwin Brothers for this  SKIN: no rash or abnormal lesions on exposed portions  MS: normal gait, moves all extremities normally  NEURO: normal gait, speech and  thought processing grossly intact, muscle tone grossly intact throughout  PSYCH: normal affect, pleasant and cooperative  ASSESSMENT AND PLAN:  Discussed the following assessment and plan:  PREVENTIVE EXAM: -Discussed and advised all [place] preventive services health task force level A and B recommendations for age, sex and risks. -Advised at least 150 minutes of exercise per week and a healthy diet with avoidance of (less then 1 serving per week) processed foods, white starches, red meat, fast foods and sweets and consisting of: * 5-9 servings of fresh fruits and vegetables (not corn or potatoes) *nuts and seeds, beans *olives and olive oil *lean meats such as fish and white chicken  *whole grains -labs, studies and vaccines per orders this encounter - HIV antibody  2. Screening for depression -neg  3. Hyperlipidemia, unspecified hyperlipidemia type - Lipid panel  4. Hyperglycemia - Hemoglobin A1c  Patient advised to return to clinic immediately if symptoms worsen or persist or new concerns.  Patient Instructions  BEFORE YOU LEAVE: -labs -follow up: yearly for physical  Schedule mammogram and gyn exam with your gynecologist.  Vit D3 [phone] daily  We have ordered labs or studies at this visit. It can take up to 1-2 weeks for results and processing. IF results require follow up or explanation, we will call you with instructions. Clinically stable results will be released to your [facility]. If you have not heard from [place] or cannot find your results in [facility] in 2 weeks please contact our office at [phone].  If you are not yet signed up for [facility], please consider signing up.   Preventive Care 40-64 Years, Female Preventive care refers to lifestyle choices and visits with your health care provider that can promote health and wellness. What does preventive care include?  A yearly physical exam. This is also called an annual well check.  Dental exams once or twice a  year.  Routine eye exams. Ask your health care provider how often you should have your eyes checked.  Personal lifestyle choices, including: ? Daily care of your teeth and gums. ? Regular physical activity. ? Eating a healthy diet. ? Avoiding tobacco and drug use. ? Limiting alcohol use. ? Practicing safe sex. ? Taking vitamin and mineral supplements as recommended by your health care provider. What happens during an annual well check? The services and screenings done by your health care provider during your annual well check will depend on your age, overall health, lifestyle risk factors, and family history of disease. Counseling Your health care provider may ask you questions about your:  Alcohol use.  Tobacco use.  Drug use.  Emotional well-being.  Home and relationship well-being.  Sexual activity.  Eating habits.  Work and work Statistician.  Method of birth control.  Menstrual cycle.  Pregnancy history.  Screening  You may have the following tests or measurements:  Height, weight, and BMI.  Blood pressure.  Lipid and cholesterol levels. These may be checked every 5 years, or more frequently if you are over 39 years old.  Skin check.  Lung cancer screening. You may have this screening every year starting at age 46 if you have a 30-pack-year history of smoking and currently smoke or have quit within the past 15 years.  Fecal occult blood test (FOBT) of the stool. You may have this test every year starting at age 74.  Flexible sigmoidoscopy or colonoscopy. You may have a sigmoidoscopy every 5 years or a colonoscopy every 10 years starting at age 64.  Hepatitis C blood test.  Hepatitis B blood test.  Sexually transmitted disease (STD) testing.  Diabetes screening. This is done by checking your blood sugar (glucose) after you have not eaten for a while (fasting). You may have this done every 1-3 years.  Mammogram. This may be done every 1-2 years. Talk  to your health care provider about when you should start having regular mammograms. This may depend on whether you have a family history of breast cancer.  BRCA-related cancer screening. This may be done if you have a family history of breast, ovarian, tubal, or peritoneal cancers.  Pelvic exam and Pap test. This may be done every 3 years starting at age 21. Starting at age 49, this may be done every 5 years if you have a Pap test in combination with an HPV test.  Bone density scan. This is done to screen for osteoporosis. You may have this scan if you are at high risk for osteoporosis.  Discuss your test results, treatment options, and if necessary, the need for more tests with your health care provider. Vaccines Your health care provider may recommend certain vaccines, such as:  Influenza vaccine. This is recommended every year.  Tetanus, diphtheria, and acellular pertussis (Tdap, Td) vaccine. You may need a Td booster every 10 years.  Varicella vaccine. You may need this if you have not been vaccinated.  Zoster vaccine. You may need this after age 28.  Measles, mumps, and rubella (MMR) vaccine. You may need at least one dose of MMR if you were born in 1957 or later. You may also need a second dose.  Pneumococcal 13-valent conjugate (PCV13) vaccine. You may need this if you have certain conditions and were not previously vaccinated.  Pneumococcal polysaccharide (PPSV23) vaccine. You may need one or two doses if you smoke cigarettes or if you have certain conditions.  Meningococcal vaccine. You may need this if you have certain conditions.  Hepatitis A vaccine. You may need this if you have certain conditions or if you travel or work in places where you may be exposed to hepatitis A.  Hepatitis B vaccine. You may need this if you have certain conditions or if you travel or work in places where you may be exposed to hepatitis B.  Haemophilus influenzae type b (Hib) vaccine. You may  need this if you have certain conditions.  Talk to your health care provider about which screenings and vaccines you need and how often you need them. This information is not intended to replace advice given to you by your health care provider. Make sure you discuss any questions you have with your health care provider. Document Released: 10/09/2015 Document Revised: 06/01/2016 Document Reviewed: 07/14/2015 Elsevier Interactive Patient Education  [employer].  No follow-ups on file.  Demichael Traum R Dirck Butch, DO   

## 2018-04-09 ENCOUNTER — Ambulatory Visit (INDEPENDENT_AMBULATORY_CARE_PROVIDER_SITE_OTHER): Payer: BLUE CROSS/BLUE SHIELD | Admitting: Family Medicine

## 2018-04-09 ENCOUNTER — Encounter: Payer: Self-pay | Admitting: Family Medicine

## 2018-04-09 VITALS — BP 80/60 | HR 86 | Temp 98.1°F | Ht 63.0 in | Wt 135.2 lb

## 2018-04-09 DIAGNOSIS — Z0000 Encounter for general adult medical examination without abnormal findings: Secondary | ICD-10-CM | POA: Diagnosis not present

## 2018-04-09 DIAGNOSIS — E785 Hyperlipidemia, unspecified: Secondary | ICD-10-CM | POA: Diagnosis not present

## 2018-04-09 DIAGNOSIS — Z1331 Encounter for screening for depression: Secondary | ICD-10-CM | POA: Diagnosis not present

## 2018-04-09 DIAGNOSIS — R739 Hyperglycemia, unspecified: Secondary | ICD-10-CM

## 2018-04-09 LAB — HEMOGLOBIN A1C: HEMOGLOBIN A1C: 5.9 % (ref 4.6–6.5)

## 2018-04-09 LAB — LIPID PANEL
CHOLESTEROL: 235 mg/dL — AB (ref 0–200)
HDL: 70.2 mg/dL (ref >39.00)
LDL CALC: 143 mg/dL — AB (ref 0–99)
NonHDL: 165.03
TRIGLYCERIDES: 108 mg/dL (ref 0.0–149.0)
Total CHOL/HDL Ratio: 3
VLDL: 21.6 mg/dL (ref 0.0–40.0)

## 2018-04-09 NOTE — Patient Instructions (Signed)
BEFORE YOU LEAVE: -labs -follow up: yearly for physical  Schedule mammogram and gyn exam with your gynecologist.  Vit D3 [phone] IU daily  We have ordered labs or studies at this visit. It can take up to 1-2 weeks for results and processing. IF results require follow up or explanation, we will call you with instructions. Clinically stable results will be released to your [facility]. If you have not heard from [place] or cannot find your results in [facility] in 2 weeks please contact our office at [phone].  If you are not yet signed up for [facility], please consider signing up.   Preventive Care 40-64 Years, Female Preventive care refers to lifestyle choices and visits with your health care provider that can promote health and wellness. What does preventive care include?  A yearly physical exam. This is also called an annual well check.  Dental exams once or twice a year.  Routine eye exams. Ask your health care provider how often you should have your eyes checked.  Personal lifestyle choices, including: ? Daily care of your teeth and gums. ? Regular physical activity. ? Eating a healthy diet. ? Avoiding tobacco and drug use. ? Limiting alcohol use. ? Practicing safe sex. ? Taking vitamin and mineral supplements as recommended by your health care provider. What happens during an annual well check? The services and screenings done by your health care provider during your annual well check will depend on your age, overall health, lifestyle risk factors, and family history of disease. Counseling Your health care provider may ask you questions about your:  Alcohol use.  Tobacco use.  Drug use.  Emotional well-being.  Home and relationship well-being.  Sexual activity.  Eating habits.  Work and work Statistician.  Method of birth control.  Menstrual cycle.  Pregnancy history.  Screening You may have the following tests or measurements:  Height, weight, and BMI.  Blood  pressure.  Lipid and cholesterol levels. These may be checked every 5 years, or more frequently if you are over 1 years old.  Skin check.  Lung cancer screening. You may have this screening every year starting at age 51 if you have a 30-pack-year history of smoking and currently smoke or have quit within the past 15 years.  Fecal occult blood test (FOBT) of the stool. You may have this test every year starting at age 44.  Flexible sigmoidoscopy or colonoscopy. You may have a sigmoidoscopy every 5 years or a colonoscopy every 10 years starting at age 79.  Hepatitis C blood test.  Hepatitis B blood test.  Sexually transmitted disease (STD) testing.  Diabetes screening. This is done by checking your blood sugar (glucose) after you have not eaten for a while (fasting). You may have this done every 1-3 years.  Mammogram. This may be done every 1-2 years. Talk to your health care provider about when you should start having regular mammograms. This may depend on whether you have a family history of breast cancer.  BRCA-related cancer screening. This may be done if you have a family history of breast, ovarian, tubal, or peritoneal cancers.  Pelvic exam and Pap test. This may be done every 3 years starting at age 24. Starting at age 31, this may be done every 5 years if you have a Pap test in combination with an HPV test.  Bone density scan. This is done to screen for osteoporosis. You may have this scan if you are at high risk for osteoporosis.  Discuss your test  results, treatment options, and if necessary, the need for more tests with your health care provider. Vaccines Your health care provider may recommend certain vaccines, such as:  Influenza vaccine. This is recommended every year.  Tetanus, diphtheria, and acellular pertussis (Tdap, Td) vaccine. You may need a Td booster every 10 years.  Varicella vaccine. You may need this if you have not been vaccinated.  Zoster vaccine. You  may need this after age 17.  Measles, mumps, and rubella (MMR) vaccine. You may need at least one dose of MMR if you were born in 1957 or later. You may also need a second dose.  Pneumococcal 13-valent conjugate (PCV13) vaccine. You may need this if you have certain conditions and were not previously vaccinated.  Pneumococcal polysaccharide (PPSV23) vaccine. You may need one or two doses if you smoke cigarettes or if you have certain conditions.  Meningococcal vaccine. You may need this if you have certain conditions.  Hepatitis A vaccine. You may need this if you have certain conditions or if you travel or work in places where you may be exposed to hepatitis A.  Hepatitis B vaccine. You may need this if you have certain conditions or if you travel or work in places where you may be exposed to hepatitis B.  Haemophilus influenzae type b (Hib) vaccine. You may need this if you have certain conditions.  Talk to your health care provider about which screenings and vaccines you need and how often you need them. This information is not intended to replace advice given to you by your health care provider. Make sure you discuss any questions you have with your health care provider. Document Released: 10/09/2015 Document Revised: 06/01/2016 Document Reviewed: 07/14/2015 Elsevier Interactive Patient Education  [employer].

## 2018-04-10 LAB — HIV ANTIBODY (ROUTINE TESTING W REFLEX): HIV: NONREACTIVE

## 2018-04-11 DIAGNOSIS — F4321 Adjustment disorder with depressed mood: Secondary | ICD-10-CM | POA: Diagnosis not present

## 2018-05-02 DIAGNOSIS — F4321 Adjustment disorder with depressed mood: Secondary | ICD-10-CM | POA: Diagnosis not present

## 2018-05-30 DIAGNOSIS — F4321 Adjustment disorder with depressed mood: Secondary | ICD-10-CM | POA: Diagnosis not present

## 2018-06-15 ENCOUNTER — Other Ambulatory Visit (INDEPENDENT_AMBULATORY_CARE_PROVIDER_SITE_OTHER): Payer: BLUE CROSS/BLUE SHIELD

## 2018-06-15 DIAGNOSIS — E221 Hyperprolactinemia: Secondary | ICD-10-CM

## 2018-06-15 DIAGNOSIS — N914 Secondary oligomenorrhea: Secondary | ICD-10-CM

## 2018-06-19 ENCOUNTER — Encounter: Payer: Self-pay | Admitting: Endocrinology

## 2018-06-19 ENCOUNTER — Ambulatory Visit: Payer: BLUE CROSS/BLUE SHIELD | Admitting: Endocrinology

## 2018-06-19 VITALS — BP 102/64 | HR 80 | Ht 63.0 in | Wt 132.0 lb

## 2018-06-19 DIAGNOSIS — E221 Hyperprolactinemia: Secondary | ICD-10-CM

## 2018-06-19 NOTE — Progress Notes (Signed)
Patient ID: Linda Barnett, female   DOB: 1976/09/09, 42 y.o.   MRN: [id number]           Chief complaint: Follow-up of high prolactin   History of Present Illness  Background history: She had a missed menstrual cycle in May 2018 prior to her visit  with her gynecologist  Her prolactin was tested and was increased in 30s Because of her potential desire for pregnancy she was recommended cabergoline in 04/2017 However patient was not wanting to start this and may have taken only 1 tablet She did have fairly regular menstrual cycles on her own subsequently  Previous prolactin levels: June 2018: 37.6 and 32 within a few days apart, the second level was done fasting in the morning  Recent history: Her menstrual cycles have been coming every month even though twice since her last visit in June the cycles were about 36 days apart and otherwise 27-28 days apart Her menstrual cycles have lasted the usual duration also She does not notice any milky breast discharge  She does not complain of any headaches  To evaluate her prolactin the macroprolactin has been ordered and results are not available She had an estradiol level done which was drawn the day before her menstrual cycle started and the level was low normal for the luteal phase at 42  MRI of pituitary gland in 04/2017 shows no pituitary abnormality  Prolactin levels:   Lab Results  Component Value Date   PROLACTIN 57.1 (H) 03/16/2018   PROLACTIN 44.9 (H) 12/11/2017   PROLACTIN 49.9 (H) 11/09/2017   PROLACTIN 33.6 (H) 05/09/2017      No results found for: TSH, FREET4   Allergies as of 06/19/2018   No Known Allergies     Medication List    as of 06/19/2018 10:49 AM   You have not been prescribed any medications.     Allergies: No Known Allergies  Past Medical History:  Diagnosis Date  . Allergy   . Asthma   . Frequent headaches   . Genital warts   . History of chicken pox     No past surgical history on  file.  Family History  Problem Relation Age of Onset  . Hypothyroidism Mother   . Alcohol abuse Father   . Arthritis Maternal Grandmother   . Breast cancer Maternal Grandmother   . Diabetes Maternal Grandmother     Social History:  reports that she has been smoking. She has never used smokeless tobacco. She reports that she drinks alcohol. She reports that she has current or past drug history. Drug: Marijuana.   Review of Systems    No history of thyroid disease.    TSH done in June 2018 was 1.8 from gynecologist   Physical Exam    BP 102/64   Pulse 80   Ht 5' 3"  (1.6 m)   Wt 132 lb (59.9 kg)   SpO2 98%   BMI 23.38 kg/m   Exam not indicated  ASSESSMENT:  HYPERPROLACTINEMIA, mild, asymptomatic and not associated with a pituitary adenoma as of 2018  As before she likely has idiopathic hyperprolactinemia with no underlying cause or medication use She has had only occasional slightly delayed menstrual cycles but normal duration  Currently not desiring pregnancy Also her estradiol level is in the approximate normal range for the luteal phase, drawn 1 day before her cycle started  Reassured her that since she does not have missed cycles are low estradiol levels that she should not  have any long-term effects   PLAN:  We will discuss the results of the macro prolactin test when it is available Otherwise she will be back in 6 months unless she starts having missed cycles   Elayne Snare 06/19/18

## 2018-06-20 DIAGNOSIS — F4321 Adjustment disorder with depressed mood: Secondary | ICD-10-CM | POA: Diagnosis not present

## 2018-06-23 LAB — MACROPROLACTIN
MONOMERIC PROLACTIN (ICMA): 43 ng/mL — AB
PROLACTIN, SERUM (ICMA): 51 ng/mL — AB
Percent Macroprolactin: 16 %

## 2018-06-23 LAB — ESTRADIOL: ESTRADIOL: 41.9 pg/mL

## 2018-07-04 DIAGNOSIS — F4321 Adjustment disorder with depressed mood: Secondary | ICD-10-CM | POA: Diagnosis not present

## 2018-07-24 DIAGNOSIS — F4321 Adjustment disorder with depressed mood: Secondary | ICD-10-CM | POA: Diagnosis not present

## 2018-08-14 ENCOUNTER — Ambulatory Visit: Payer: BLUE CROSS/BLUE SHIELD | Admitting: Family Medicine

## 2018-08-14 ENCOUNTER — Encounter: Payer: Self-pay | Admitting: Family Medicine

## 2018-08-14 VITALS — BP 108/80 | HR 65 | Temp 97.9°F | Ht 63.0 in | Wt 136.0 lb

## 2018-08-14 DIAGNOSIS — N912 Amenorrhea, unspecified: Secondary | ICD-10-CM | POA: Diagnosis not present

## 2018-08-14 DIAGNOSIS — R739 Hyperglycemia, unspecified: Secondary | ICD-10-CM | POA: Diagnosis not present

## 2018-08-14 DIAGNOSIS — E785 Hyperlipidemia, unspecified: Secondary | ICD-10-CM

## 2018-08-14 DIAGNOSIS — E221 Hyperprolactinemia: Secondary | ICD-10-CM | POA: Diagnosis not present

## 2018-08-14 LAB — LIPID PANEL
CHOLESTEROL: 197 mg/dL (ref 0–200)
HDL: 84.5 mg/dL (ref >39.00)
LDL Cholesterol: 98 mg/dL (ref 0–99)
NONHDL: 112.41
TRIGLYCERIDES: 72 mg/dL (ref 0.0–149.0)
Total CHOL/HDL Ratio: 2
VLDL: 14.4 mg/dL (ref 0.0–40.0)

## 2018-08-14 LAB — HEMOGLOBIN A1C: HEMOGLOBIN A1C: 5.9 % (ref 4.6–6.5)

## 2018-08-14 LAB — POCT URINE PREGNANCY: PREG TEST UR: NEGATIVE

## 2018-08-14 NOTE — Patient Instructions (Signed)
BEFORE YOU LEAVE: -update alcohol and smoking status -urine pregnancy test -lab -follow up: 4-6 months  Please contact your endocrinologist and gynecologist today about the changes in your menstrual cycles.   We have ordered labs or studies at this visit. It can take up to 1-2 weeks for results and processing. IF results require follow up or explanation, we will call you with instructions. Clinically stable results will be released to your [facility]. If you have not heard from [place] or cannot find your results in [facility] in 2 weeks please contact our office at [phone].  If you are not yet signed up for [facility], please consider signing up.   We recommend the following healthy lifestyle for LIFE: 1) Small portions. But, make sure to get regular (at least 3 per day), healthy meals and small healthy snacks if needed.  2) Eat a healthy clean diet.   TRY TO EAT: -at least 5-7 servings of low sugar, colorful, and nutrient rich vegetables per day (not corn, potatoes or bananas.) -berries are the best choice if you wish to eat fruit (only eat small amounts if trying to reduce weight)  -lean meets (fish, white meat of chicken or [place]) -vegan proteins for some meals - beans or tofu, whole grains, nuts and seeds -Replace bad fats with good fats - good fats include: fish, nuts and seeds, canola oil, olive oil -small amounts of low fat or non fat dairy -small amounts of100 % whole grains - check the lables -drink plenty of water  AVOID: -SUGAR, sweets, anything with added sugar, corn syrup or sweeteners - must read labels as even foods advertised as "healthy" often are loaded with sugar -if you must have a sweetener, small amounts of stevia may be best -sweetened beverages and artificially sweetened beverages -simple starches (rice, bread, potatoes, pasta, chips, etc - small amounts of 100% whole grains are ok) -red meat, pork, butter -fried foods, fast food, processed food, excessive dairy,  eggs and coconut.  3)Get at least 150 minutes of sweaty aerobic exercise per week.  4)Reduce stress - consider counseling, meditation and relaxation to balance other aspects of your life.

## 2018-08-14 NOTE — Progress Notes (Signed)
HPI:  Using dictation device. Unfortunately this device frequently misinterprets words/phrases.  Linda Barnett is a pleasant 42 y.o. here for follow up. Chronic medical problems summarized below were reviewed for changes and stability and were updated as needed below. These issues and their treatment remain stable for the most part.  She is seeing Endo for the hyperprolactinemia. Has not had a period in several months and has some hot flashes initially, now resolved. Has not seen endo or gyn about this. Denies headaches, fevers, malaise, skin changes, wt changes with this. Fasting for lipid check. CPE 04/09/2018 Due for labs  Hyperlipidemia/hyperglycemia: -Lifestyle recommendations advised  Hyperprolactinemia: -Sees Dr. Dwyane Dee, endocrinologist, for management -MRI in 2018 okay per notes  ROS: See pertinent positives and negatives per HPI.  Past Medical History:  Diagnosis Date  . Allergy   . Asthma   . Frequent headaches   . Genital warts   . History of chicken pox     History reviewed. No pertinent surgical history.  Family History  Problem Relation Age of Onset  . Hypothyroidism Mother   . Alcohol abuse Father   . Arthritis Maternal Grandmother   . Breast cancer Maternal Grandmother   . Diabetes Maternal Grandmother     SOCIAL HX: seehpi  No current outpatient medications on file.  EXAM:  Vitals:   08/14/18 0852  BP: 108/80  Pulse: 65  Temp: 97.9 F (36.6 C)    Body mass index is 24.09 kg/m.  GENERAL: vitals reviewed and listed above, alert, oriented, appears well hydrated and in no acute distress  HEENT: atraumatic, conjunttiva clear, no obvious abnormalities on inspection of external nose and ears  NECK: no obvious masses on inspection  LUNGS: clear to auscultation bilaterally, no wheezes, rales or rhonchi, good air movement  CV: HRRR, no peripheral edema  MS: moves all extremities without noticeable abnormality  ABD: soft, NTTP  PSYCH:  pleasant and cooperative, no obvious depression or anxiety  ASSESSMENT AND PLAN:  Discussed the following assessment and plan:  Hyperlipidemia, unspecified hyperlipidemia type  Hyperglycemia  Hyperprolactinemia (HCC)  Amenorrhea  -labs for lipid and blood sugar check -lifestyle recs -upreg test -discussed potential etiologies amenorrhea and eval - opted for follow up with gyn/endo given PMH about this and she agrees to call -Patient advised to return or notify a doctor immediately if symptoms worsen or persist or new concerns arise.  Patient Instructions  BEFORE YOU LEAVE: -update alcohol and smoking status -urine pregnancy test -lab -follow up: 4-6 months  Please contact your endocrinologist and gynecologist today about the changes in your menstrual cycles.   We have ordered labs or studies at this visit. It can take up to 1-2 weeks for results and processing. IF results require follow up or explanation, we will call you with instructions. Clinically stable results will be released to your [facility]. If you have not heard from [place] or cannot find your results in [facility] in 2 weeks please contact our office at [phone].  If you are not yet signed up for [facility], please consider signing up.   We recommend the following healthy lifestyle for LIFE: 1) Small portions. But, make sure to get regular (at least 3 per day), healthy meals and small healthy snacks if needed.  2) Eat a healthy clean diet.   TRY TO EAT: -at least 5-7 servings of low sugar, colorful, and nutrient rich vegetables per day (not corn, potatoes or bananas.) -berries are the best choice if you wish to eat fruit (  only eat small amounts if trying to reduce weight)  -lean meets (fish, white meat of chicken or [place]) -vegan proteins for some meals - beans or tofu, whole grains, nuts and seeds -Replace bad fats with good fats - good fats include: fish, nuts and seeds, canola oil, olive oil -small amounts of low  fat or non fat dairy -small amounts of100 % whole grains - check the lables -drink plenty of water  AVOID: -SUGAR, sweets, anything with added sugar, corn syrup or sweeteners - must read labels as even foods advertised as "healthy" often are loaded with sugar -if you must have a sweetener, small amounts of stevia may be best -sweetened beverages and artificially sweetened beverages -simple starches (rice, bread, potatoes, pasta, chips, etc - small amounts of 100% whole grains are ok) -red meat, pork, butter -fried foods, fast food, processed food, excessive dairy, eggs and coconut.  3)Get at least 150 minutes of sweaty aerobic exercise per week.  4)Reduce stress - consider counseling, meditation and relaxation to balance other aspects of your life.          [name], DO

## 2018-08-14 NOTE — Addendum Note (Signed)
Addended by: Agnes Lawrence on: 08/14/2018 09:28 AM   Modules accepted: Orders

## 2018-08-14 NOTE — Addendum Note (Signed)
Addended by: [name] on: 08/14/2018 09:37 AM   Modules accepted: Orders

## 2018-09-07 DIAGNOSIS — F4321 Adjustment disorder with depressed mood: Secondary | ICD-10-CM | POA: Diagnosis not present

## 2018-09-26 NOTE — L&D Delivery Note (Signed)
Delivery Note  First Stage - 10/24 midnight to 10/26 labor mode: induction due to oligiohydramnios cytotec x 1 10/24 ~0200 Cervical balloon 10/24 AM ~0930 labor (contractions) onset: 10/24 @2000  with AROM AROM at 2000 on 10/24 Pitocin augmentation at 1400 10/25 analgesia /anesthesia intrapartum: epidural AM of 10/26  Second Stage - 07/22/2019:  complete dilation at 0652 onset of pushing at 0700 FHR second stage category 2  delivery of a viable female at 1021 by CNM in ROA position tight nuchal cord x 1 wrapped around neck and bilateral compound presentation of hands at neck reduced down body then hands freed from cord and cord slid down body for birth of body  newborn initially limp but responding to stimulation - first cry just at 1 minute - placed on maternal abdomen after first cry - cord clamped and cut then moved to warmer for additional assessment  cord double clamped after cessation of pulsation, cut by FOB cord blood sample collected  arterial cord blood sample collected and sent  Third Stage: placenta delivered shultz intact with 3 VC @ 1028 placenta disposition: hospital disposal uterine tone firm / bleeding small complications: none  no lacerations identified  estimated blood loss (mL): 2  mom to postpartum / baby to Couplet care / Skin to Skin.  Newborn: Apgar Scores: 1-minute: 6                           5-minute: 9   feeding plan: breastfeeding birth weight: pending  Linda Barnett CNM, MSN, [facility] 07/22/2019, 10:43 AM

## 2018-10-03 DIAGNOSIS — F4321 Adjustment disorder with depressed mood: Secondary | ICD-10-CM | POA: Diagnosis not present

## 2018-10-31 DIAGNOSIS — F4321 Adjustment disorder with depressed mood: Secondary | ICD-10-CM | POA: Diagnosis not present

## 2018-11-21 DIAGNOSIS — F4321 Adjustment disorder with depressed mood: Secondary | ICD-10-CM | POA: Diagnosis not present

## 2018-12-12 ENCOUNTER — Telehealth: Payer: Self-pay | Admitting: Family Medicine

## 2018-12-12 ENCOUNTER — Ambulatory Visit (INDEPENDENT_AMBULATORY_CARE_PROVIDER_SITE_OTHER): Payer: BLUE CROSS/BLUE SHIELD | Admitting: Adult Health

## 2018-12-12 ENCOUNTER — Other Ambulatory Visit: Payer: Self-pay

## 2018-12-12 ENCOUNTER — Encounter: Payer: Self-pay | Admitting: Adult Health

## 2018-12-12 VITALS — HR 85 | Temp 98.1°F | Resp 16

## 2018-12-12 DIAGNOSIS — J302 Other seasonal allergic rhinitis: Secondary | ICD-10-CM

## 2018-12-12 DIAGNOSIS — F4321 Adjustment disorder with depressed mood: Secondary | ICD-10-CM | POA: Diagnosis not present

## 2018-12-12 NOTE — Telephone Encounter (Signed)
LM to call back for CV PreScreen

## 2018-12-12 NOTE — Progress Notes (Signed)
Subjective:    Patient ID: Linda Barnett, female    DOB: Apr 22, 1976, 43 y.o.   MRN: [id number]  HPI  43 year old female who  has a past medical history of Allergy, Asthma, Frequent headaches, Genital warts, and History of chicken pox.  She presents to clinic today for an acute complaint.  Symptoms include that of sinus congestion, semi productive cough, chills, and feeling run down. Symptoms have been present for 3-4 days.   She denies fevers, SOB, or wheezing.   She has seasonal allergies and takes Claritin, she missed her treatment over the weekend    Review of Systems See HPI   Past Medical History:  Diagnosis Date  . Allergy   . Asthma   . Frequent headaches   . Genital warts   . History of chicken pox     Social History   Socioeconomic History  . Marital status: Single    Spouse name: Not on file  . Number of children: Not on file  . Years of education: Not on file  . Highest education level: Not on file  Occupational History  . Not on file  Social Needs  . Financial resource strain: Not on file  . Food insecurity:    Worry: Not on file    Inability: Not on file  . Transportation needs:    Medical: Not on file    Non-medical: Not on file  Tobacco Use  . Smoking status: Former Smoker    Last attempt to quit: 02/11/2018    Years since quitting: 0.8  . Smokeless tobacco: Never Used  Substance and Sexual Activity  . Alcohol use: Yes    Comment: 2-3 drinks   . Drug use: Yes    Types: Marijuana    Comment: regular use  . Sexual activity: Yes  Lifestyle  . Physical activity:    Days per week: Not on file    Minutes per session: Not on file  . Stress: Not on file  Relationships  . Social connections:    Talks on phone: Not on file    Gets together: Not on file    Attends religious service: Not on file    Active member of club or organization: Not on file    Attends meetings of clubs or organizations: Not on file    Relationship status: Not on file   . Intimate partner violence:    Fear of current or ex partner: Not on file    Emotionally abused: Not on file    Physically abused: Not on file    Forced sexual activity: Not on file  Other Topics Concern  . Not on file  Social History Narrative   Work or School: Therapist, sports      Home Situation: lives with partner - female Biochemist, clinical)      Spiritual Beliefs:       Lifestyle: ketogenic diet, walking    No past surgical history on file.  Family History  Problem Relation Age of Onset  . Hypothyroidism Mother   . Alcohol abuse Father   . Arthritis Maternal Grandmother   . Breast cancer Maternal Grandmother   . Diabetes Maternal Grandmother     No Known Allergies  No current outpatient medications on file prior to visit.   No current facility-administered medications on file prior to visit.     Pulse 85   Temp 98.1 F (36.7 C)   Resp 16  Objective:   Physical Exam Vitals signs reviewed.  HENT:     Nose: Nose normal. No congestion or rhinorrhea.     Right Turbinates: Enlarged.     Left Turbinates: Enlarged.     Mouth/Throat:     Mouth: Mucous membranes are moist.     Pharynx: Oropharynx is clear. No oropharyngeal exudate or posterior oropharyngeal erythema.  Cardiovascular:     Rate and Rhythm: Normal rate and regular rhythm.     Pulses: Normal pulses.     Heart sounds: Normal heart sounds.  Pulmonary:     Effort: Pulmonary effort is normal.     Breath sounds: Normal breath sounds.  Skin:    General: Skin is warm and dry.  Neurological:     General: No focal deficit present.     Mental Status: She is alert and oriented to person, place, and time.       Assessment & Plan:  1. Seasonal allergies No red flags for Covid 19 and does not meet criteria for testing. Exam presents as seasonal allergies. Restart Claritin  - Return precautions reviewed   Dorothyann Peng, NP

## 2018-12-12 NOTE — Telephone Encounter (Signed)
Questions for Screening COVID-19 Run-down Slight cough, occasional, wet but not productive, possible pnd from allergies Chills No fever + seasonal allergies  Symptom onset: x2d  Travel or Contacts: no travel  During this illness, did/does the patient experience any of the following symptoms? Fever >100.65F []   Yes [x]   No []   Unknown Subjective fever (felt feverish) []   Yes [x]   No []   Unknown Chills [x]   Yes []   No []   Unknown Muscle aches (myalgia) []   Yes [x]   No []   Unknown Runny nose (rhinorrhea) []   Yes [x]   No []   Unknown Sore throat []   Yes []   No [x]   Unknown Cough (new onset or worsening of chronic cough) [x]   Yes []   No []   Unknown Shortness of breath (dyspnea) []   Yes [x]   No []   Unknown Nausea or vomiting []   Yes []   No []   Unknown Headache []   Yes [x]   No []   Unknown Abdominal pain  []   Yes [x]   No []   Unknown Diarrhea (?3 loose/looser than normal stools/24hr period) []   Yes [x]   No []   Unknown Other, specify:_____________________________________________   Patient risk factors: Smoker? [x]   Current []   Former []   Never If female, currently pregnant? []   Yes [x]   No  Patient Active Problem List   Diagnosis Date Noted  . Anemia 06/09/2017  . Hyperlipidemia 06/09/2017  . Hyperglycemia 06/09/2017  . Hyperprolactinemia ([place]) 05/09/2017    Plan:  []   High risk for COVID-19 with red flags go to ED (with CP, SOB, weak/lightheaded, or fever > 101.5). Call ahead.  []   High risk for COVID-19 but stable will have car visit. Inform provider and coordinate time. Will be completed in afternoon. [x]   No red flags but URI signs or symptoms will go through side door and be seen in dedicated room.   Note: Referral to telemedicine is an appropriate alternative disposition for higher risk but stable. [facility]/e-Visit: [phone].

## 2018-12-13 ENCOUNTER — Other Ambulatory Visit: Payer: Self-pay | Admitting: Endocrinology

## 2018-12-13 ENCOUNTER — Other Ambulatory Visit: Payer: BLUE CROSS/BLUE SHIELD

## 2018-12-13 DIAGNOSIS — N926 Irregular menstruation, unspecified: Secondary | ICD-10-CM

## 2018-12-13 DIAGNOSIS — E221 Hyperprolactinemia: Secondary | ICD-10-CM | POA: Diagnosis not present

## 2018-12-14 LAB — PREGNANCY, URINE: Preg Test, Ur: POSITIVE — AB

## 2018-12-14 LAB — ESTRADIOL, FREE

## 2018-12-17 LAB — PROLACTIN: Prolactin: 95.1 ng/mL — ABNORMAL HIGH (ref 4.8–23.3)

## 2018-12-18 ENCOUNTER — Ambulatory Visit: Payer: BLUE CROSS/BLUE SHIELD | Admitting: Endocrinology

## 2018-12-18 ENCOUNTER — Other Ambulatory Visit: Payer: Self-pay

## 2018-12-18 ENCOUNTER — Encounter: Payer: Self-pay | Admitting: Endocrinology

## 2018-12-18 VITALS — BP 108/70 | HR 64 | Temp 98.6°F | Ht 63.0 in | Wt 138.6 lb

## 2018-12-18 DIAGNOSIS — E221 Hyperprolactinemia: Secondary | ICD-10-CM

## 2018-12-18 NOTE — Progress Notes (Signed)
Patient ID: Linda Barnett, female   DOB: 1976/09/11, 43 y.o.   MRN: [id number]           Chief complaint: Follow-up of high prolactin   History of Present Illness  Background history: She had a missed menstrual cycle in May 2018 prior to her visit  with her gynecologist  Her prolactin was tested and was increased in 30s Because of her potential desire for pregnancy she was recommended cabergoline in 04/2017 However patient was not wanting to start this and may have taken only 1 tablet She did have fairly regular menstrual cycles on her own subsequently  Previous prolactin levels: June 2018: 37.6 and 32 within a few days apart, the second level was done fasting in the morning  Recent history:  She was just diagnosed to have early pregnancy and her last menstrual cycle was 57 days ago As expected her prolactin level has increased with the pregnancy She does not notice any milky breast discharge  She does not complain of any headaches  To evaluate her prolactin the macroprolactin was evaluated and was found to be only 16% She had an estradiol level done which was drawn the day before her menstrual cycle started and the level was low normal for the luteal phase at 42  MRI of pituitary gland in 04/2017 shows no pituitary abnormality  Prolactin levels:   Lab Results  Component Value Date   PROLACTIN 95.1 (H) 12/13/2018   PROLACTIN 57.1 (H) 03/16/2018   PROLACTIN 44.9 (H) 12/11/2017   PROLACTIN 49.9 (H) 11/09/2017      No results found for: TSH, FREET4   Allergies as of 12/18/2018   No Known Allergies     Medication List    as of December 18, 2018  8:47 AM   You have not been prescribed any medications.     Allergies: No Known Allergies  Past Medical History:  Diagnosis Date  . Allergy   . Asthma   . Frequent headaches   . Genital warts   . History of chicken pox     History reviewed. No pertinent surgical history.  Family History  Problem Relation Age of  Onset  . Hypothyroidism Mother   . Alcohol abuse Father   . Arthritis Maternal Grandmother   . Breast cancer Maternal Grandmother   . Diabetes Maternal Grandmother     Social History:  reports that she quit smoking about 10 months ago. She has never used smokeless tobacco. She reports current alcohol use. She reports current drug use. Drug: Marijuana.   Review of Systems    No history of thyroid disease.    TSH done in June 2018 was 1.8 from gynecologist   Physical Exam    BP 108/70 (BP Location: Left Arm, Patient Position: Sitting, Cuff Size: Normal)   Pulse 64   Temp 98.6 F (37 C) (Oral)   Ht 5' 3"  (1.6 m)   Wt 138 lb 9.6 oz (62.9 kg)   SpO2 98%   BMI 24.55 kg/m    Exam not indicated  ASSESSMENT:  HYPERPROLACTINEMIA, mild, asymptomatic and not associated with a pituitary adenoma as of 2018  She has had idiopathic hyperprolactinemia with no underlying cause or medication use and no evidence of macro prolactinemia She has had only occasional slightly delayed menstrual cycles in the past and no galactorrhea  With her being pregnant currently prolactin is higher as expected However since she does not have a pituitary adenoma her hyperprolactinemia should not be an  issue during pregnancy   PLAN:  She is going to establish with a obstetrician She will follow-up in about a year unless she has any new symptoms or issues   Elayne Snare 12/18/18

## 2018-12-19 DIAGNOSIS — Z3201 Encounter for pregnancy test, result positive: Secondary | ICD-10-CM | POA: Diagnosis not present

## 2018-12-19 DIAGNOSIS — F4321 Adjustment disorder with depressed mood: Secondary | ICD-10-CM | POA: Diagnosis not present

## 2018-12-20 ENCOUNTER — Other Ambulatory Visit: Payer: Self-pay | Admitting: Obstetrics and Gynecology

## 2018-12-20 ENCOUNTER — Ambulatory Visit: Payer: BLUE CROSS/BLUE SHIELD | Admitting: Family Medicine

## 2018-12-31 DIAGNOSIS — Z3400 Encounter for supervision of normal first pregnancy, unspecified trimester: Secondary | ICD-10-CM | POA: Diagnosis not present

## 2018-12-31 LAB — OB RESULTS CONSOLE ANTIBODY SCREEN: Antibody Screen: NEGATIVE

## 2018-12-31 LAB — OB RESULTS CONSOLE HEPATITIS B SURFACE ANTIGEN: Hepatitis B Surface Ag: NEGATIVE

## 2018-12-31 LAB — OB RESULTS CONSOLE RUBELLA ANTIBODY, IGM: Rubella: IMMUNE

## 2018-12-31 LAB — OB RESULTS CONSOLE HIV ANTIBODY (ROUTINE TESTING): HIV: NONREACTIVE

## 2018-12-31 LAB — OB RESULTS CONSOLE ABO/RH: RH Type: NEGATIVE

## 2018-12-31 LAB — OB RESULTS CONSOLE GC/CHLAMYDIA
Chlamydia: NEGATIVE
Gonorrhea: NEGATIVE

## 2018-12-31 LAB — OB RESULTS CONSOLE RPR: RPR: NONREACTIVE

## 2019-01-02 DIAGNOSIS — F4321 Adjustment disorder with depressed mood: Secondary | ICD-10-CM | POA: Diagnosis not present

## 2019-01-03 DIAGNOSIS — Z3A10 10 weeks gestation of pregnancy: Secondary | ICD-10-CM | POA: Diagnosis not present

## 2019-01-03 DIAGNOSIS — Z124 Encounter for screening for malignant neoplasm of cervix: Secondary | ICD-10-CM | POA: Diagnosis not present

## 2019-01-03 DIAGNOSIS — Z118 Encounter for screening for other infectious and parasitic diseases: Secondary | ICD-10-CM | POA: Diagnosis not present

## 2019-01-03 DIAGNOSIS — O09511 Supervision of elderly primigravida, first trimester: Secondary | ICD-10-CM | POA: Diagnosis not present

## 2019-01-03 DIAGNOSIS — Z113 Encounter for screening for infections with a predominantly sexual mode of transmission: Secondary | ICD-10-CM | POA: Diagnosis not present

## 2019-01-03 DIAGNOSIS — Z3689 Encounter for other specified antenatal screening: Secondary | ICD-10-CM | POA: Diagnosis not present

## 2019-01-03 DIAGNOSIS — Z1151 Encounter for screening for human papillomavirus (HPV): Secondary | ICD-10-CM | POA: Diagnosis not present

## 2019-01-03 DIAGNOSIS — Z36 Encounter for antenatal screening for chromosomal anomalies: Secondary | ICD-10-CM | POA: Diagnosis not present

## 2019-01-03 DIAGNOSIS — B373 Candidiasis of vulva and vagina: Secondary | ICD-10-CM | POA: Diagnosis not present

## 2019-01-14 ENCOUNTER — Encounter: Payer: Self-pay | Admitting: Family Medicine

## 2019-01-14 ENCOUNTER — Ambulatory Visit (INDEPENDENT_AMBULATORY_CARE_PROVIDER_SITE_OTHER): Payer: BLUE CROSS/BLUE SHIELD | Admitting: Family Medicine

## 2019-01-14 ENCOUNTER — Other Ambulatory Visit: Payer: Self-pay

## 2019-01-14 DIAGNOSIS — E785 Hyperlipidemia, unspecified: Secondary | ICD-10-CM

## 2019-01-14 DIAGNOSIS — R739 Hyperglycemia, unspecified: Secondary | ICD-10-CM | POA: Diagnosis not present

## 2019-01-14 DIAGNOSIS — E221 Hyperprolactinemia: Secondary | ICD-10-CM

## 2019-01-14 DIAGNOSIS — Z3A12 12 weeks gestation of pregnancy: Secondary | ICD-10-CM

## 2019-01-14 NOTE — Progress Notes (Signed)
Virtual Visit via Video Note  I connected with Linda Barnett  on 01/14/19 at  8:45 AM EDT by a video enabled telemedicine application and verified that I am speaking with the correct person using two identifiers.  Location patient: home Location provider:work or home office Persons participating in the virtual visit: patient, provider  I discussed the limitations of evaluation and management by telemedicine and the availability of in person appointments. The patient expressed understanding and agreed to proceed.   HPI:  Linda Barnett is a pleasant 43 y.o. here for follow up. Chronic medical problems summarized below were reviewed for changes and stability and were updated as needed below. These issues and their treatment remain stable for the most part.  She had been having some irregular menses and found out she is pregnant. She is [redacted] weeks pregnant! She has been feeling a little nauseous - but now doing better. Her labs were done in November for the blood sugar and the cholesterol and looked good. Denies CP, SOB, DOE, treatment intolerance or new symptoms. She is working from home.   CPE 04/09/2018  Hyperlipidemia/hyperglycemia: -Lifestyle recommendations advised -has been eating ok - but maybe a little more sugar and carbs -she is exercising - doing cough to 5k  Hyperprolactinemia: -Sees Dr. Dwyane Dee, endocrinologist, for management - was seen recently in March -MRI in 2018 okay per notes  ROS: See pertinent positives and negatives per HPI.  Past Medical History:  Diagnosis Date  . Allergy   . Asthma   . Frequent headaches   . Genital warts   . History of chicken pox     History reviewed. No pertinent surgical history.  Family History  Problem Relation Age of Onset  . Hypothyroidism Mother   . Alcohol abuse Father   . Arthritis Maternal Grandmother   . Breast cancer Maternal Grandmother   . Diabetes Maternal Grandmother     SOCIAL HX:    Current Outpatient Medications:   .  Prenatal Vit-Fe Fumarate-FA (PRENATAL VITAMIN PO), Take by mouth daily., Disp: , Rfl:  .  Pyridoxine HCl (VITAMIN B-6 PO), Take by mouth daily., Disp: , Rfl:   EXAM:  VITALS per patient if applicable:  GENERAL: alert, oriented, appears well and in no acute distress  HEENT: atraumatic, conjunttiva clear, no obvious abnormalities on inspection of external nose and ears  NECK: normal movements of the head and neck  LUNGS: on inspection no signs of respiratory distress, breathing rate appears normal, no obvious gross SOB, gasping or wheezing  CV: no obvious cyanosis  MS: moves all visible extremities without noticeable abnormality  PSYCH/NEURO: pleasant and cooperative, no obvious depression or anxiety, speech and thought processing grossly intact  ASSESSMENT AND PLAN:  Discussed the following assessment and plan:  Hyperglycemia  Hyperlipidemia, unspecified hyperlipidemia type  Hyperprolactinemia (HCC)  [redacted] weeks gestation of pregnancy  Congratulated on pregnancy.  Counseled on lifestyle and advised a healthy low sugar diet and regular gentle exercise. Reviewed prior labs and opted to postpone labs today given COVID19 pandemic and seeing ob.  Discussed social distancing in [place] pandemic.  Continue care with Ob and endocrine.  TOC vist with Dr. Ethlyn Gallery (she already has appt) and she is aware I will not be doing in clinic medicine after May 1st.    I discussed the assessment and treatment plan with the patient. The patient was provided an opportunity to ask questions and all were answered. The patient agreed with the plan and demonstrated an understanding of the instructions.  The patient was advised to call back or seek an in-person evaluation if the symptoms worsen or if the condition fails to improve as anticipated.   [name], DO

## 2019-01-16 DIAGNOSIS — F4321 Adjustment disorder with depressed mood: Secondary | ICD-10-CM | POA: Diagnosis not present

## 2019-01-22 DIAGNOSIS — Z3A13 13 weeks gestation of pregnancy: Secondary | ICD-10-CM | POA: Diagnosis not present

## 2019-01-22 DIAGNOSIS — Z3682 Encounter for antenatal screening for nuchal translucency: Secondary | ICD-10-CM | POA: Diagnosis not present

## 2019-01-22 DIAGNOSIS — O09511 Supervision of elderly primigravida, first trimester: Secondary | ICD-10-CM | POA: Diagnosis not present

## 2019-01-30 DIAGNOSIS — F4321 Adjustment disorder with depressed mood: Secondary | ICD-10-CM | POA: Diagnosis not present

## 2019-02-13 DIAGNOSIS — Z361 Encounter for antenatal screening for raised alphafetoprotein level: Secondary | ICD-10-CM | POA: Diagnosis not present

## 2019-02-13 DIAGNOSIS — O09512 Supervision of elderly primigravida, second trimester: Secondary | ICD-10-CM | POA: Diagnosis not present

## 2019-02-13 DIAGNOSIS — Z3A16 16 weeks gestation of pregnancy: Secondary | ICD-10-CM | POA: Diagnosis not present

## 2019-02-20 DIAGNOSIS — F4321 Adjustment disorder with depressed mood: Secondary | ICD-10-CM | POA: Diagnosis not present

## 2019-03-05 DIAGNOSIS — Z3A19 19 weeks gestation of pregnancy: Secondary | ICD-10-CM | POA: Diagnosis not present

## 2019-03-05 DIAGNOSIS — O09512 Supervision of elderly primigravida, second trimester: Secondary | ICD-10-CM | POA: Diagnosis not present

## 2019-03-09 IMAGING — MR MR HEAD WO/W CM
15 of 19 series · 35 of 48 positions shown · IV contrast (multihance)
Comparison: None.

CLINICAL DATA: Elevated prolactin level

EXAM:
MRI HEAD WITHOUT AND WITH CONTRAST
TECHNIQUE: Multiplanar, multiecho pulse sequences of the brain and surrounding
structures were obtained without and with intravenous contrast.
CONTRAST:  10mL MULTIHANCE GADOBENATE DIMEGLUMINE 529 MG/ML IV SOLN

[Series 2: T1 · sagittal · 5.0mm · 0.45mm/px · 3 of 19 slices shown]
[im 1/19]
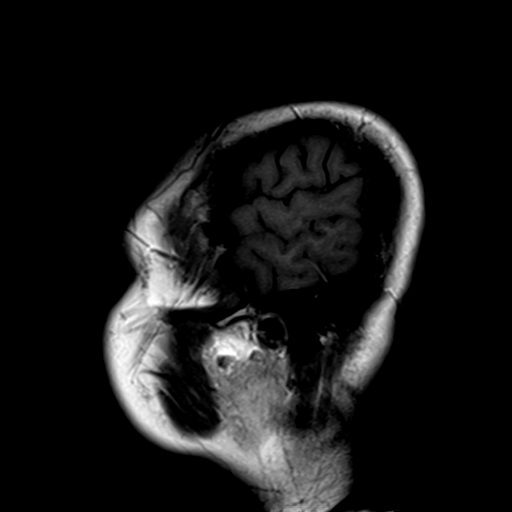
[im 10/19]
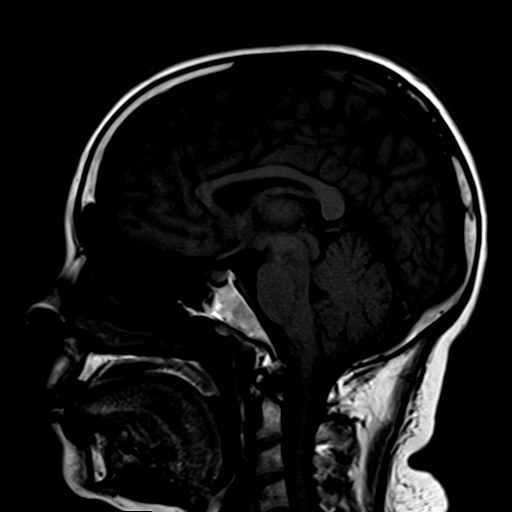
[im 19/19]
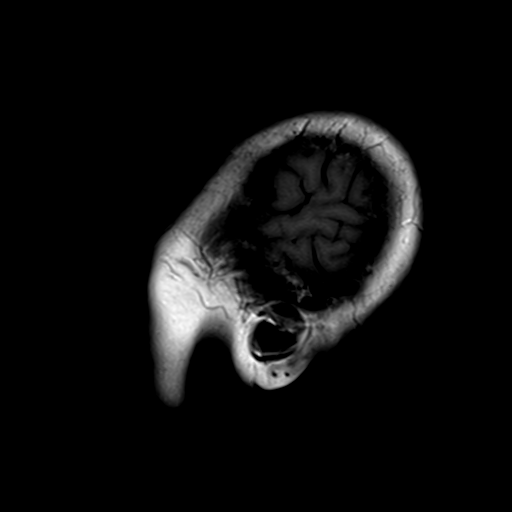

[Series 3: DWI · axial · 3.0mm · 1.80mm/px · z∈[-33,+114]mm · 11 of 100 slices shown]
[im 1/100]
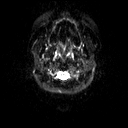
[im 10/100]
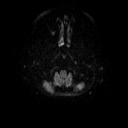
[im 20/100]
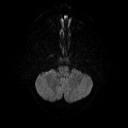
[im 30/100]
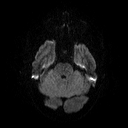
[im 40/100]
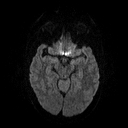
[im 50/100]
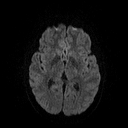
[im 60/100]
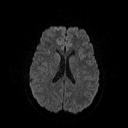
[im 70/100]
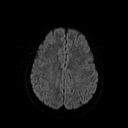
[im 80/100]
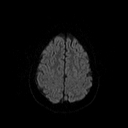
[im 90/100]
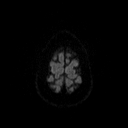
[im 100/100]
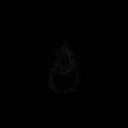

[Series 4: dwi_adc · axial · 3.0mm · 1.80mm/px · z∈[-33,+114]mm · 5 of 49 slices shown]
[im 1/49]
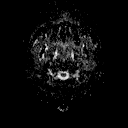
[im 13/49]
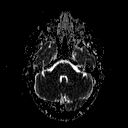
[im 25/49]
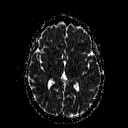
[im 37/49]
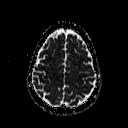
[im 49/49]
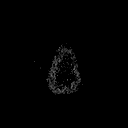

[Series 5: T2 · axial · 5.0mm · 0.36mm/px · z∈[-26,+110]mm · 2 of 22 slices shown]
[im 1/22]
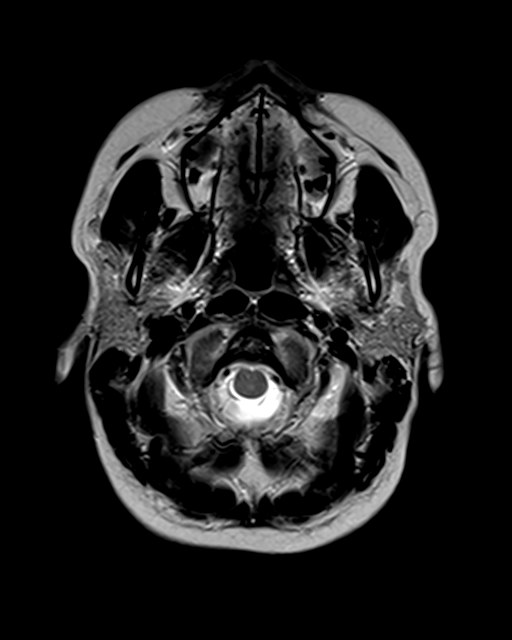
[im 22/22]
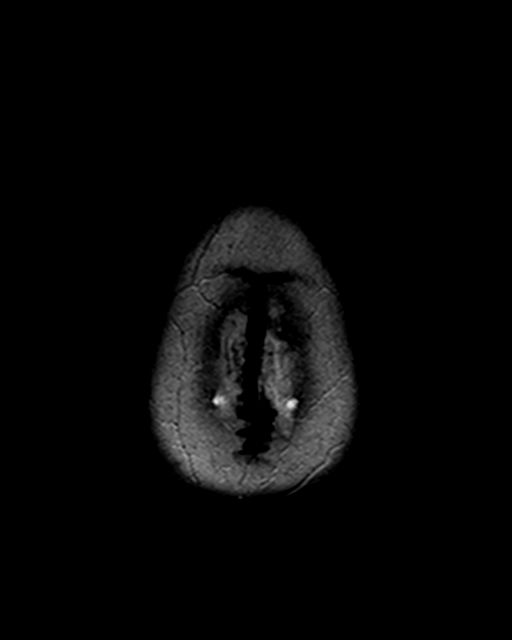

[Series 6: FLAIR · axial · 3.0mm · 0.45mm/px · z∈[-27,+108]mm · 3 of 30 slices shown]
[im 1/30]
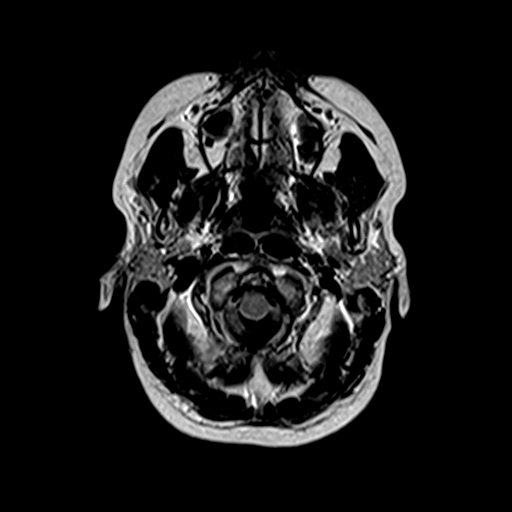
[im 15/30]
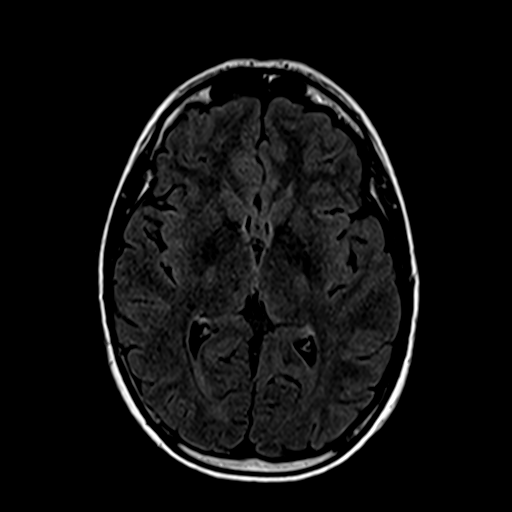
[im 30/30]
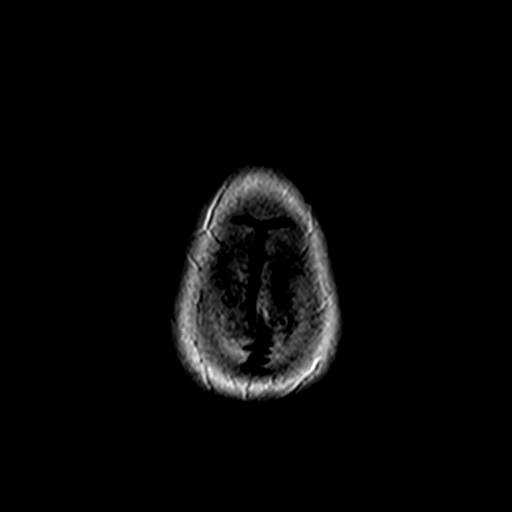

[Series 7: axial grad (blood) · axial · 5.0mm · 0.45mm/px · z∈[-28,+113]mm · 2 of 22 slices shown]
[im 1/22]
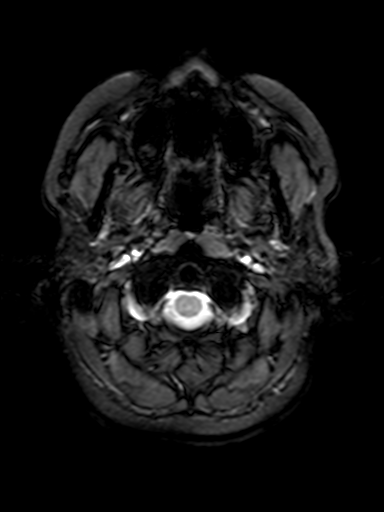
[im 22/22]
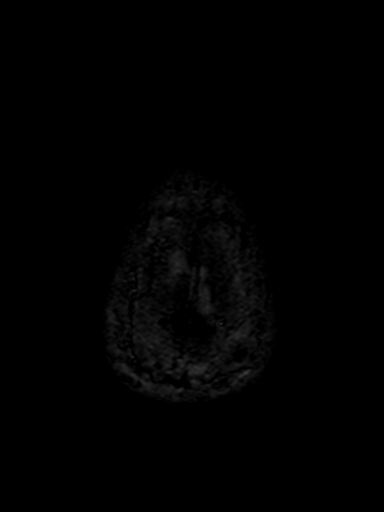

[Series 8: sag 3mm · sagittal · 3.0mm · 0.33mm/px · 1 of 11 slices shown]
[im 1/11]
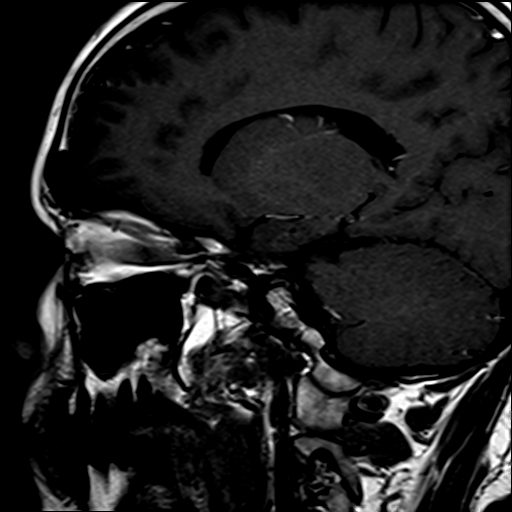

[Series 9: cor 3mm · coronal · 3.0mm · 0.33mm/px · 1 of 11 slices shown]
[im 1/11]
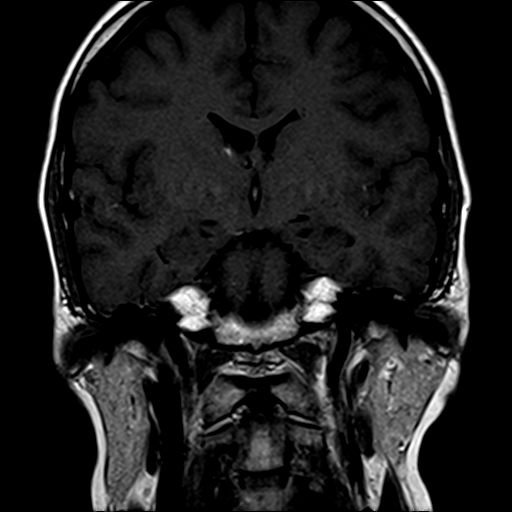

[Series 10: pre cor dynamic · coronal · non-contrast · 3.0mm · 0.35mm/px · 1 of 7 slices shown]
[im 1/7]
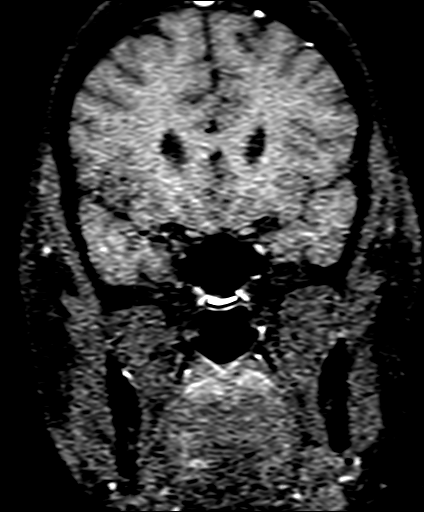

[Series 11: post fs cor · coronal · 3.0mm · 0.35mm/px · 1 of 7 slices shown (1 of 6)]
[im 1/7]
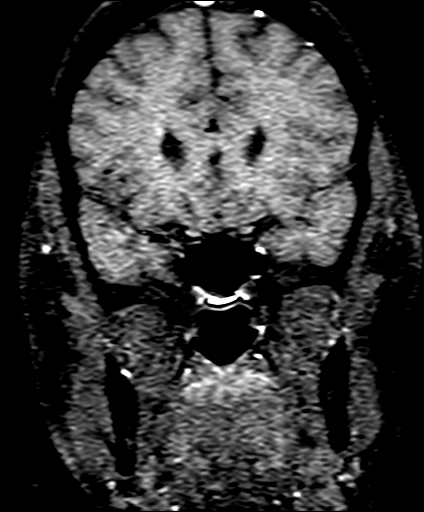

[Series 12: post fs cor · coronal · 3.0mm · 0.35mm/px · 1 of 7 slices shown (2 of 6)]
[im 1/7]
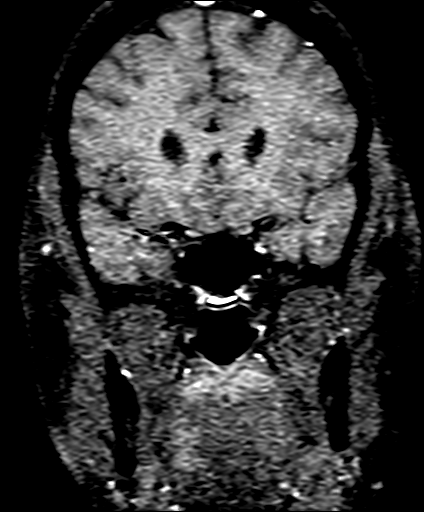

[Series 13: post fs cor · coronal · 3.0mm · 0.35mm/px · 1 of 7 slices shown (3 of 6)]
[im 1/7]
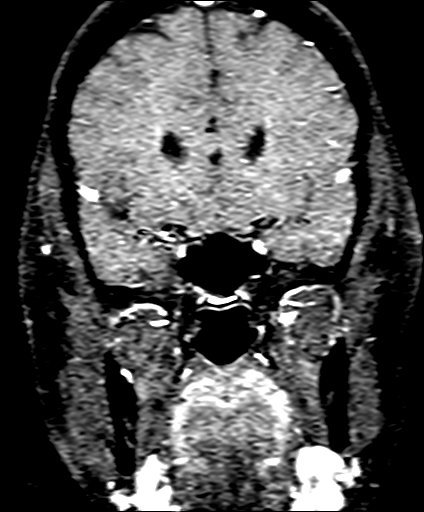

[Series 14: post fs cor · coronal · 3.0mm · 0.35mm/px · 1 of 7 slices shown (4 of 6)]
[im 1/7]
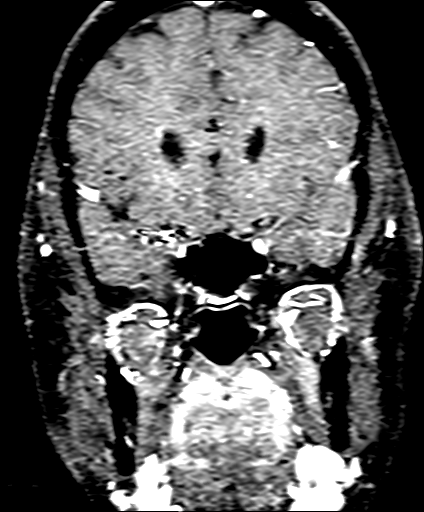

[Series 15: post fs cor · coronal · 3.0mm · 0.35mm/px · 1 of 7 slices shown (5 of 6)]
[im 1/7]
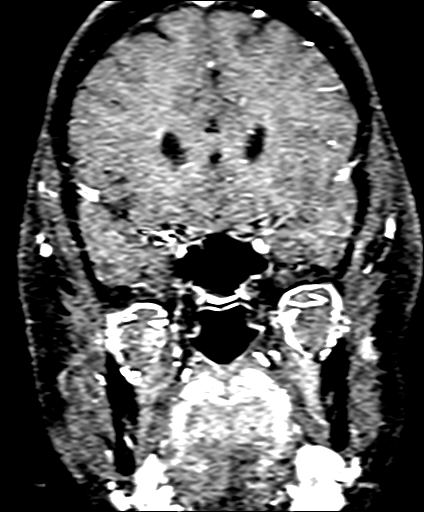

[Series 16: post fs cor · coronal · 3.0mm · 0.35mm/px · 1 of 7 slices shown (6 of 6)]
[im 1/7]
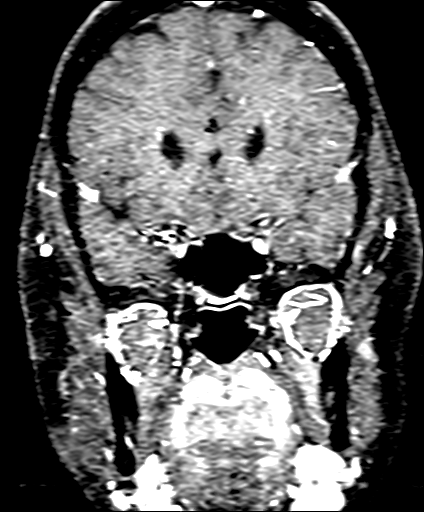

[35 of 48 positions shown; findings below may reference images not displayed]

FINDINGS: Brain: Dynamic pituitary protocol utilized

Pituitary normal in size measuring 8.7 mm in height. Infundibulum
midline. No compression of the optic chiasm. Cavernous sinus normal.
No enhancing pituitary adenoma. Hypoenhancement in the right
posterior sella is felt to be volume averaging of the dorsum sella
and not a true pituitary lesion.

Ventricle size normal. Negative for infarct, hemorrhage, or mass
lesion.

Vascular: Normal arterial flow void

Skull and upper cervical spine: Negative

Sinuses/Orbits: Negative

Other: None
IMPRESSION: Pituitary normal in size. hypoenhancing area on dynamic scanning in
the right posterior sella is felt to be volume averaging of the
dorsum sella. No pituitary lesion identified.

Remainder of  the brain is normal.

## 2019-03-14 DIAGNOSIS — F4321 Adjustment disorder with depressed mood: Secondary | ICD-10-CM | POA: Diagnosis not present

## 2019-04-02 DIAGNOSIS — O09512 Supervision of elderly primigravida, second trimester: Secondary | ICD-10-CM | POA: Diagnosis not present

## 2019-04-02 DIAGNOSIS — Z3A23 23 weeks gestation of pregnancy: Secondary | ICD-10-CM | POA: Diagnosis not present

## 2019-04-10 DIAGNOSIS — F4321 Adjustment disorder with depressed mood: Secondary | ICD-10-CM | POA: Diagnosis not present

## 2019-04-15 DIAGNOSIS — Z3A24 24 weeks gestation of pregnancy: Secondary | ICD-10-CM | POA: Diagnosis not present

## 2019-04-15 DIAGNOSIS — O09512 Supervision of elderly primigravida, second trimester: Secondary | ICD-10-CM | POA: Diagnosis not present

## 2019-04-29 DIAGNOSIS — O09512 Supervision of elderly primigravida, second trimester: Secondary | ICD-10-CM | POA: Diagnosis not present

## 2019-04-29 DIAGNOSIS — Z3689 Encounter for other specified antenatal screening: Secondary | ICD-10-CM | POA: Diagnosis not present

## 2019-04-29 DIAGNOSIS — Z3A26 26 weeks gestation of pregnancy: Secondary | ICD-10-CM | POA: Diagnosis not present

## 2019-05-08 DIAGNOSIS — F4321 Adjustment disorder with depressed mood: Secondary | ICD-10-CM | POA: Diagnosis not present

## 2019-05-16 DIAGNOSIS — Z3689 Encounter for other specified antenatal screening: Secondary | ICD-10-CM | POA: Diagnosis not present

## 2019-05-16 DIAGNOSIS — O360130 Maternal care for anti-D [Rh] antibodies, third trimester, not applicable or unspecified: Secondary | ICD-10-CM | POA: Diagnosis not present

## 2019-05-16 DIAGNOSIS — Z3A29 29 weeks gestation of pregnancy: Secondary | ICD-10-CM | POA: Diagnosis not present

## 2019-05-16 DIAGNOSIS — O09513 Supervision of elderly primigravida, third trimester: Secondary | ICD-10-CM | POA: Diagnosis not present

## 2019-06-04 DIAGNOSIS — O09513 Supervision of elderly primigravida, third trimester: Secondary | ICD-10-CM | POA: Diagnosis not present

## 2019-06-04 DIAGNOSIS — Z3A32 32 weeks gestation of pregnancy: Secondary | ICD-10-CM | POA: Diagnosis not present

## 2019-06-05 DIAGNOSIS — F4321 Adjustment disorder with depressed mood: Secondary | ICD-10-CM | POA: Diagnosis not present

## 2019-06-10 ENCOUNTER — Ambulatory Visit (INDEPENDENT_AMBULATORY_CARE_PROVIDER_SITE_OTHER): Payer: BC Managed Care – PPO | Admitting: Family Medicine

## 2019-06-10 ENCOUNTER — Encounter: Payer: Self-pay | Admitting: Family Medicine

## 2019-06-10 ENCOUNTER — Other Ambulatory Visit: Payer: Self-pay

## 2019-06-10 DIAGNOSIS — R739 Hyperglycemia, unspecified: Secondary | ICD-10-CM | POA: Diagnosis not present

## 2019-06-10 DIAGNOSIS — E785 Hyperlipidemia, unspecified: Secondary | ICD-10-CM | POA: Diagnosis not present

## 2019-06-10 DIAGNOSIS — E221 Hyperprolactinemia: Secondary | ICD-10-CM | POA: Diagnosis not present

## 2019-06-10 NOTE — Progress Notes (Signed)
Virtual Visit via Video Note  I connected with Linda Barnett  by a video enabled telemedicine application and verified that I am speaking with the correct person using two identifiers.  Location patient: home Location provider:work office Persons participating in the virtual visit: patient, provider  I discussed the limitations of evaluation and management by telemedicine and the availability of in person appointments. The patient expressed understanding and agreed to proceed.  Linda Barnett DOB: 1976-06-25 Encounter date: 06/10/2019  This is a 43 y.o. female who presents to establish care. No chief complaint on file.   History of present illness:  Currently pregnant with EDD November 3rd. Following with [facility]. Feeling pretty well during pregnancy. Up until 2 weeks ago feels like COVID was actually silver lining being able to work at home and rest when needed. Getting more uncomfortable now. Just in past week started getting some swelling in ankles, knuckles. BP has been good entire time. Sugar testing was ok. Did have some lower iron levels.   Hyperprolactinemia: follows with Dr. Dwyane Dee. Hasn't followed during pregnancy.   HL/Hyperglycemia: diet controlled.   Headaches: has had these whole life; more of dehydration issue and much better with good hydration.   Asthma: this was more childhood dx.   Seasonal allergies: has been taking nasonex in August to prevent allergies from being bad in September. Also takes claritin but hasn't started yet.   Past Medical History:  Diagnosis Date  . Allergy   . Asthma    childhood  . Frequent headaches   . Genital warts   . History of chicken pox    History reviewed. No pertinent surgical history. No Known Allergies No outpatient medications have been marked as taking for the 06/10/19 encounter (Office Visit) with Caren Macadam, MD.   Social History   Tobacco Use  . Smoking status: Former Smoker    Packs/day: 1.00   Years: 15.00    Pack years: 15.00    Types: Cigarettes    Quit date: 02/11/2018    Years since quitting: 1.3  . Smokeless tobacco: Never Used  . Tobacco comment: on again off again in 10 years  Substance Use Topics  . Alcohol use: Yes    Comment: occasional wine   Family History  Problem Relation Age of Onset  . Hypothyroidism Mother   . Other Mother        pre-diabetic  . Alcohol abuse Father   . Other Father        substance abuse  . Arthritis Maternal Grandmother   . Breast cancer Maternal Grandmother 62  . Diabetes Maternal Grandmother   . Dementia Maternal Grandmother 73  . Thyroid disease Sister   . Gout Brother   . Healthy Maternal Grandfather   . ALS Paternal Grandmother        passed in car accident  . Dementia Paternal Grandfather 62  . Hypothyroidism Maternal Aunt   . Hyperthyroidism Maternal Uncle   . Other Sister        ?fibroids; had ablation     Review of Systems  Constitutional: Negative for chills, fatigue and fever.  Respiratory: Negative for cough, chest tightness, shortness of breath and wheezing.   Cardiovascular: Negative for chest pain, palpitations and leg swelling.    Objective:  There were no vitals taken for this visit.      BP Readings from Last 3 Encounters:  12/18/18 108/70  08/14/18 108/80  06/19/18 102/64   Wt Readings from Last 3  Encounters:  12/18/18 138 lb 9.6 oz (62.9 kg)  08/14/18 136 lb (61.7 kg)  06/19/18 132 lb (59.9 kg)    Physical Exam  GENERAL: alert, oriented, appears well and in no acute distress  HEENT: atraumatic, conjunctiva clear, no obvious abnormalities on inspection of external nose and ears  NECK: normal movements of the head and neck  LUNGS: on inspection no signs of respiratory distress, breathing rate appears normal, no obvious gross SOB, gasping or wheezing  CV: no obvious cyanosis  MS: moves all visible extremities without noticeable abnormality  PSYCH/NEURO: pleasant and cooperative, no  obvious depression or anxiety, speech and thought processing grossly intact  SKIN: no facial or neck abnormalities.   Assessment/Plan:  1. Hyperprolactinemia ([place]) Will resume following with endo after birth.   2. Hyperlipidemia, unspecified hyperlipidemia type Has been diet controlled. Can repeat bloodwork when she is able to return to office for physical after delivery.  3. Hyperglycemia Sugar has been controlled during pregnancy. She is eating healthy. Regular exercise.   Return for physical exam after delivery.  Micheline Rough, MD  I discussed the assessment and treatment plan with the patient. The patient was provided an opportunity to ask questions and all were answered. The patient agreed with the plan and demonstrated an understanding of the instructions.   The patient was advised to call back or seek an in-person evaluation if the symptoms worsen or if the condition fails to improve as anticipated.  I provided minutes of non-face-to-face time during this encounter.

## 2019-06-20 DIAGNOSIS — Z3A34 34 weeks gestation of pregnancy: Secondary | ICD-10-CM | POA: Diagnosis not present

## 2019-06-20 DIAGNOSIS — O09513 Supervision of elderly primigravida, third trimester: Secondary | ICD-10-CM | POA: Diagnosis not present

## 2019-06-20 DIAGNOSIS — Z23 Encounter for immunization: Secondary | ICD-10-CM | POA: Diagnosis not present

## 2019-07-03 DIAGNOSIS — F4321 Adjustment disorder with depressed mood: Secondary | ICD-10-CM | POA: Diagnosis not present

## 2019-07-04 DIAGNOSIS — Z3A36 36 weeks gestation of pregnancy: Secondary | ICD-10-CM | POA: Diagnosis not present

## 2019-07-04 DIAGNOSIS — Z23 Encounter for immunization: Secondary | ICD-10-CM | POA: Diagnosis not present

## 2019-07-04 DIAGNOSIS — O99013 Anemia complicating pregnancy, third trimester: Secondary | ICD-10-CM | POA: Diagnosis not present

## 2019-07-04 DIAGNOSIS — O09513 Supervision of elderly primigravida, third trimester: Secondary | ICD-10-CM | POA: Diagnosis not present

## 2019-07-04 DIAGNOSIS — Z3685 Encounter for antenatal screening for Streptococcus B: Secondary | ICD-10-CM | POA: Diagnosis not present

## 2019-07-04 LAB — OB RESULTS CONSOLE GBS: GBS: NEGATIVE

## 2019-07-09 DIAGNOSIS — Z3A37 37 weeks gestation of pregnancy: Secondary | ICD-10-CM | POA: Diagnosis not present

## 2019-07-09 DIAGNOSIS — O99013 Anemia complicating pregnancy, third trimester: Secondary | ICD-10-CM | POA: Diagnosis not present

## 2019-07-18 ENCOUNTER — Other Ambulatory Visit: Payer: Self-pay | Admitting: Obstetrics and Gynecology

## 2019-07-18 DIAGNOSIS — O09513 Supervision of elderly primigravida, third trimester: Secondary | ICD-10-CM | POA: Diagnosis not present

## 2019-07-18 DIAGNOSIS — Z3A38 38 weeks gestation of pregnancy: Secondary | ICD-10-CM | POA: Diagnosis not present

## 2019-07-19 ENCOUNTER — Telehealth (HOSPITAL_COMMUNITY): Payer: Self-pay | Admitting: *Deleted

## 2019-07-19 ENCOUNTER — Encounter (HOSPITAL_COMMUNITY): Payer: Self-pay | Admitting: *Deleted

## 2019-07-19 ENCOUNTER — Ambulatory Visit (HOSPITAL_COMMUNITY)
Admission: RE | Admit: 2019-07-19 | Discharge: 2019-07-19 | Disposition: A | Discharge status: Home / Self Care | Payer: BC Managed Care – PPO | Source: Ambulatory Visit | Attending: Obstetrics and Gynecology | Admitting: Obstetrics and Gynecology

## 2019-07-19 ENCOUNTER — Encounter (HOSPITAL_COMMUNITY): Payer: BC Managed Care – PPO

## 2019-07-19 ENCOUNTER — Other Ambulatory Visit: Payer: Self-pay

## 2019-07-19 DIAGNOSIS — O326XX0 Maternal care for compound presentation, not applicable or unspecified: Secondary | ICD-10-CM | POA: Diagnosis not present

## 2019-07-19 DIAGNOSIS — D649 Anemia, unspecified: Secondary | ICD-10-CM | POA: Diagnosis not present

## 2019-07-19 DIAGNOSIS — Z87891 Personal history of nicotine dependence: Secondary | ICD-10-CM | POA: Diagnosis not present

## 2019-07-19 DIAGNOSIS — O9902 Anemia complicating childbirth: Secondary | ICD-10-CM | POA: Diagnosis not present

## 2019-07-19 DIAGNOSIS — O9081 Anemia of the puerperium: Secondary | ICD-10-CM | POA: Diagnosis not present

## 2019-07-19 DIAGNOSIS — Z6791 Unspecified blood type, Rh negative: Secondary | ICD-10-CM | POA: Diagnosis not present

## 2019-07-19 DIAGNOSIS — Z23 Encounter for immunization: Secondary | ICD-10-CM | POA: Diagnosis not present

## 2019-07-19 DIAGNOSIS — D62 Acute posthemorrhagic anemia: Secondary | ICD-10-CM | POA: Diagnosis not present

## 2019-07-19 DIAGNOSIS — O4103X0 Oligohydramnios, third trimester, not applicable or unspecified: Secondary | ICD-10-CM | POA: Diagnosis not present

## 2019-07-19 DIAGNOSIS — Z20828 Contact with and (suspected) exposure to other viral communicable diseases: Secondary | ICD-10-CM | POA: Diagnosis not present

## 2019-07-19 DIAGNOSIS — Z3A38 38 weeks gestation of pregnancy: Secondary | ICD-10-CM | POA: Diagnosis not present

## 2019-07-19 DIAGNOSIS — O26893 Other specified pregnancy related conditions, third trimester: Secondary | ICD-10-CM | POA: Diagnosis not present

## 2019-07-19 DIAGNOSIS — Z01812 Encounter for preprocedural laboratory examination: Secondary | ICD-10-CM | POA: Insufficient documentation

## 2019-07-19 LAB — SARS CORONAVIRUS 2 (TAT 6-24 HRS): SARS Coronavirus 2: NEGATIVE

## 2019-07-19 NOTE — MAU Note (Signed)
Pt tolerated swab. Denied symptoms *

## 2019-07-19 NOTE — Telephone Encounter (Signed)
Preadmission screen  

## 2019-07-20 ENCOUNTER — Other Ambulatory Visit: Payer: Self-pay

## 2019-07-20 ENCOUNTER — Inpatient Hospital Stay (HOSPITAL_COMMUNITY)
Admission: AD | Admit: 2019-07-20 | Discharge: 2019-07-24 | DRG: 806 | Disposition: A | Discharge status: Home / Self Care | Payer: BC Managed Care – PPO | Attending: Obstetrics & Gynecology | Admitting: Obstetrics & Gynecology

## 2019-07-20 ENCOUNTER — Encounter (HOSPITAL_COMMUNITY): Payer: Self-pay

## 2019-07-20 ENCOUNTER — Inpatient Hospital Stay (HOSPITAL_COMMUNITY): Payer: BC Managed Care – PPO

## 2019-07-20 DIAGNOSIS — O326XX0 Maternal care for compound presentation, not applicable or unspecified: Secondary | ICD-10-CM | POA: Diagnosis present

## 2019-07-20 DIAGNOSIS — Z6791 Unspecified blood type, Rh negative: Secondary | ICD-10-CM | POA: Diagnosis not present

## 2019-07-20 DIAGNOSIS — O4103X0 Oligohydramnios, third trimester, not applicable or unspecified: Principal | ICD-10-CM | POA: Diagnosis present

## 2019-07-20 DIAGNOSIS — D62 Acute posthemorrhagic anemia: Secondary | ICD-10-CM | POA: Diagnosis not present

## 2019-07-20 DIAGNOSIS — O26893 Other specified pregnancy related conditions, third trimester: Secondary | ICD-10-CM | POA: Diagnosis present

## 2019-07-20 DIAGNOSIS — Z3A38 38 weeks gestation of pregnancy: Secondary | ICD-10-CM

## 2019-07-20 DIAGNOSIS — O9081 Anemia of the puerperium: Secondary | ICD-10-CM | POA: Diagnosis not present

## 2019-07-20 DIAGNOSIS — Z87891 Personal history of nicotine dependence: Secondary | ICD-10-CM | POA: Diagnosis not present

## 2019-07-20 DIAGNOSIS — Z20828 Contact with and (suspected) exposure to other viral communicable diseases: Secondary | ICD-10-CM | POA: Diagnosis present

## 2019-07-20 DIAGNOSIS — Z3490 Encounter for supervision of normal pregnancy, unspecified, unspecified trimester: Secondary | ICD-10-CM | POA: Diagnosis present

## 2019-07-20 HISTORY — DX: Other complications of anesthesia, initial encounter: T88.59XA

## 2019-07-20 LAB — CBC
HCT: 34.7 % — ABNORMAL LOW (ref 36.0–46.0)
Hemoglobin: 11.5 g/dL — ABNORMAL LOW (ref 12.0–15.0)
MCH: 30.4 pg (ref 26.0–34.0)
MCHC: 33.1 g/dL (ref 30.0–36.0)
MCV: 91.8 fL (ref 80.0–100.0)
Platelets: 180 10*3/uL (ref 150–400)
RBC: 3.78 MIL/uL — ABNORMAL LOW (ref 3.87–5.11)
RDW: 13.2 % (ref 11.5–15.5)
WBC: 9.4 10*3/uL (ref 4.0–10.5)
nRBC: 0 % (ref 0.0–0.2)

## 2019-07-20 LAB — RPR: RPR Ser Ql: NONREACTIVE

## 2019-07-20 MED ORDER — SOD CITRATE-CITRIC ACID 500-334 MG/5ML PO SOLN: 30.0000 mL | ORAL | Status: DC | PRN

## 2019-07-20 MED ORDER — LACTATED RINGERS IV SOLN
500.0000 mL | INTRAVENOUS | Status: DC | PRN
  Administered 2019-07-20 – 2019-07-21 (×2): 500 mL via INTRAVENOUS
  Administered 2019-07-22: 300 mL via INTRAVENOUS
  Administered 2019-07-22: 500 mL via INTRAVENOUS

## 2019-07-20 MED ORDER — OXYTOCIN 40 UNITS IN NORMAL SALINE INFUSION - SIMPLE MED
2.5000 [IU]/h | INTRAVENOUS | Status: DC
  Filled 2019-07-20: qty 1000

## 2019-07-20 MED ORDER — LACTATED RINGERS IV SOLN
INTRAVENOUS | Status: DC
  Administered 2019-07-20 – 2019-07-22 (×4): via INTRAVENOUS

## 2019-07-20 MED ORDER — TERBUTALINE SULFATE 1 MG/ML IJ SOLN: 0.2500 mg | Freq: Once | INTRAMUSCULAR | Status: DC | PRN

## 2019-07-20 MED ORDER — ONDANSETRON HCL 4 MG/2ML IJ SOLN: 4.0000 mg | Freq: Four times a day (QID) | INTRAMUSCULAR | Status: DC | PRN

## 2019-07-20 MED ORDER — OXYTOCIN BOLUS FROM INFUSION
500.0000 mL | Freq: Once | INTRAVENOUS | Status: AC
  Administered 2019-07-22: 500 mL via INTRAVENOUS

## 2019-07-20 MED ORDER — MISOPROSTOL 25 MCG QUARTER TABLET
25.0000 ug | ORAL_TABLET | ORAL | Status: DC | PRN
  Administered 2019-07-20: 25 ug via VAGINAL
  Filled 2019-07-20: qty 1

## 2019-07-20 MED ORDER — ACETAMINOPHEN 325 MG PO TABS: 650.0000 mg | ORAL_TABLET | ORAL | Status: DC | PRN

## 2019-07-20 MED ORDER — LIDOCAINE HCL (PF) 1 % IJ SOLN: 30.0000 mL | INTRAMUSCULAR | Status: DC | PRN

## 2019-07-20 NOTE — Progress Notes (Signed)
Subjective:   feeling moderate cramps and some backache trying to rest now as much as possible - questioning breakfast and lunch plan doula - Amelia on stand-by to come when actively laboring  Objective:   VS: BP (!) 99/57   Pulse 65   Temp 98.4 F (36.9 C) (Oral)   Resp 17   Ht 5' 2"  (1.575 m)   Wt 77 kg   SpO2 97%   BMI 31.04 kg/m  FHR : baseline 118 / variability moderate / accelerations + / no decelerations Toco: contractions every 2 minutes / mild / 40-80 sec  cervix : mid-position / soft / 1 cm / 70% / -1  cervical balloon placed without difficulty - moderate discomfort with placement  membranes: intact  Assessment /Plan:  Induction of labor - oligiohydramnios FHR category 1 previous prolonged decel versus loss of contact - reactive immediately prior to and after (?) decel  Cervical balloon with traction Q 2 hours   Continuous EFM Activity ad lib Regular diet until labor established - ok regular diet breakfast then switch to light laboring diet   Linda Barnett CNM, MSN, [facility] 07/20/2019, 9:36 AM

## 2019-07-20 NOTE — Progress Notes (Signed)
Subjective:   ctx less intense in past hour - but feeling low abdomen and back  Objective:   VS: BP (!) 99/57   Pulse 78   Temp 98.6 F (37 C) (Oral)   Resp 18   Ht 5' 2"  (1.575 m)   Wt 77 kg   SpO2 97%   BMI 31.04 kg/m  FHR : baseline 120 / variability moderate / accelerations + / no decelerations Toco: contractions every 2-5 minutes / mild-moderate  cervical balloon in vagina - deflated for easier removal  cervix : 5cm / 90 % vtx  / 0  membranes: BBOW - swept membranes then AROM      small clear fluid with bloody show  Assessment /Plan:  latent labor FHR category 1 expectant management - doula at bedside encouraged to rest until ctx painful then up ad lib with labor support   Artelia Laroche CNM, MSN, [facility] 07/20/2019, 8:11 PM

## 2019-07-20 NOTE — H&P (Signed)
OB ADMISSION/ HISTORY & PHYSICAL:  Admission Date: 07/20/2019 12:16 AM  Admit Diagnosis: 38 weeks with oligiohydramnios  Linda Barnett is a 43 y.o. female presenting for induction of labor.  Prenatal History: G1P0   EDC : 07/30/2019, by Other Basis  Prenatal care at [facility]  Primary Ob Provider: CNMs Prenatal course complicated by AMA / oligiohydramnios  Prenatal Labs: ABO, Rh: --/--/A NEG (10/24 0135) Antibody: negative - Rhogam antepartum Rubella: Immune (04/06 0000)  RPR: Nonreactive (04/06 0000)  HBsAg: Negative (04/06 0000)  HIV: Non-reactive (04/06 0000)  ZOX:WRUEAV GBS: Negative/-- (10/08 0000)  GC/CHL: negative  OB History  Gravida Para Term Preterm AB Living  1            SAB TAB Ectopic Multiple Live Births               # Outcome Date GA Lbr Len/2nd Weight Sex Delivery Anes PTL Lv  1 Current             Medical / Surgical History: Past medical history:  Past Medical History:  Diagnosis Date  . Allergy   . Asthma    childhood  . Complication of anesthesia    pt states after having her wisdom teeth out it took a long time for her to wake up from anesthesia   . Frequent headaches   . Genital warts   . History of chicken pox     Past surgical history:  Past Surgical History:  Procedure Laterality Date  . WISDOM TOOTH EXTRACTION     Family History:  Family History  Problem Relation Age of Onset  . Hypothyroidism Mother   . Other Mother        pre-diabetic  . Diabetes Mother   . Alcohol abuse Father   . Other Father        substance abuse  . Arthritis Maternal Grandmother   . Breast cancer Maternal Grandmother 74  . Diabetes Maternal Grandmother   . Dementia Maternal Grandmother 44  . Cancer Maternal Grandmother   . Thyroid disease Sister   . Gout Brother   . Healthy Maternal Grandfather   . ALS Paternal Grandmother        passed in car accident  . Dementia Paternal Grandfather 82  . Hypothyroidism Maternal Aunt    . Hyperthyroidism Maternal Uncle   . Other Sister        ?fibroids; had ablation    Social History:  reports that she quit smoking about 17 months ago. Her smoking use included cigarettes. She has a 15.00 pack-year smoking history. She has never used smokeless tobacco. She reports current alcohol use. She reports previous drug use. Drug: Marijuana.  Allergies: Patient has no known allergies.   Current Medications at time of admission:  Prior to Admission medications   Medication Sig Start Date End Date Taking? Authorizing Provider  Prenatal Vit-Fe Fumarate-FA (PRENATAL VITAMIN PO) Take by mouth daily.    [provider]  Pyridoxine HCl (VITAMIN B-6 PO) Take by mouth daily.    [provider]   Review of Systems: active FM  Physical Exam: VS: Blood pressure (!) 99/57, pulse 65, temperature 98.4 F (36.9 C), temperature source Oral, resp. rate 17, height 5' 2"  (1.575 m), weight 77 kg, SpO2 97 %. alert and oriented, appears calm and comfortable heart:RRR lung fields clear with ausculation abdomen soft and non-tender, non-distended  uterus gravid and non-tender / AGA extremities: no edema  cervical exam: on arrival  FT / 50% / -3 FHR: baseline rate 120 / variability moderate / accelerations + / no decelerations TOCO: occasional ctx  Assessment: [redacted] weeks gestation induction of labor with unfavorable cervix FHR category 1  Plan:  Admit cytotec and cervical balloon to AROM plan for IOL  Dr Pamala Hurry notified of admission / plan of care  [place], MSN, [facility] 07/20/2019, 9:44 AM

## 2019-07-20 NOTE — Progress Notes (Signed)
Linda Barnett, CNM, updated on pt FHR and ctx pattern. Notified that it would have been time for pt to receive another dose of cytotec but pt contracting too much for RN to place. No new orders received. Will continue to monitor.

## 2019-07-21 ENCOUNTER — Inpatient Hospital Stay (HOSPITAL_COMMUNITY): Payer: BC Managed Care – PPO | Admitting: Anesthesiology

## 2019-07-21 MED ORDER — FAMOTIDINE IN NACL 20-0.9 MG/50ML-% IV SOLN
20.0000 mg | Freq: Once | INTRAVENOUS | Status: AC
  Administered 2019-07-22: 20 mg via INTRAVENOUS
  Filled 2019-07-21: qty 50

## 2019-07-21 MED ORDER — NALBUPHINE HCL 10 MG/ML IJ SOLN
10.0000 mg | Freq: Once | INTRAMUSCULAR | Status: AC
  Administered 2019-07-21: 10 mg via INTRAVENOUS
  Filled 2019-07-21: qty 1

## 2019-07-21 MED ORDER — EPHEDRINE 5 MG/ML INJ: 10.0000 mg | INTRAVENOUS | Status: DC | PRN

## 2019-07-21 MED ORDER — LACTATED RINGERS IV SOLN
500.0000 mL | Freq: Once | INTRAVENOUS | Status: AC
  Administered 2019-07-22: 500 mL via INTRAVENOUS

## 2019-07-21 MED ORDER — PHENYLEPHRINE 40 MCG/ML (10ML) SYRINGE FOR IV PUSH (FOR BLOOD PRESSURE SUPPORT)
80.0000 ug | PREFILLED_SYRINGE | INTRAVENOUS | Status: DC | PRN
  Administered 2019-07-22: 80 ug via INTRAVENOUS

## 2019-07-21 MED ORDER — TERBUTALINE SULFATE 1 MG/ML IJ SOLN: 0.2500 mg | Freq: Once | INTRAMUSCULAR | Status: DC | PRN

## 2019-07-21 MED ORDER — OXYTOCIN 40 UNITS IN NORMAL SALINE INFUSION - SIMPLE MED
1.0000 m[IU]/min | INTRAVENOUS | Status: DC
  Administered 2019-07-21: 2 m[IU]/min via INTRAVENOUS

## 2019-07-21 MED ORDER — LIDOCAINE HCL (PF) 1 % IJ SOLN
INTRAMUSCULAR | Status: DC | PRN
  Administered 2019-07-21 (×2): 4 mL via EPIDURAL

## 2019-07-21 MED ORDER — OXYTOCIN 40 UNITS IN NORMAL SALINE INFUSION - SIMPLE MED: 1.0000 m[IU]/min | INTRAVENOUS | Status: DC

## 2019-07-21 MED ORDER — SODIUM CHLORIDE (PF) 0.9 % IJ SOLN
INTRAMUSCULAR | Status: DC | PRN
  Administered 2019-07-21: 12 mL/h via EPIDURAL

## 2019-07-21 MED ORDER — FENTANYL-BUPIVACAINE-NACL 0.5-0.125-0.9 MG/250ML-% EP SOLN: 12.0000 mL/h | EPIDURAL | Status: DC | PRN

## 2019-07-21 MED ORDER — DIPHENHYDRAMINE HCL 50 MG/ML IJ SOLN: 12.5000 mg | INTRAMUSCULAR | Status: DC | PRN

## 2019-07-21 MED ORDER — FENTANYL-BUPIVACAINE-NACL 0.5-0.125-0.9 MG/250ML-% EP SOLN
EPIDURAL | Status: AC
  Filled 2019-07-21: qty 250

## 2019-07-21 MED ORDER — PHENYLEPHRINE 40 MCG/ML (10ML) SYRINGE FOR IV PUSH (FOR BLOOD PRESSURE SUPPORT)
80.0000 ug | PREFILLED_SYRINGE | INTRAVENOUS | Status: DC | PRN
  Filled 2019-07-21: qty 10

## 2019-07-21 NOTE — Anesthesia Preprocedure Evaluation (Signed)
Anesthesia Evaluation  Patient identified by MRN, date of birth, ID band Patient awake    Reviewed: Allergy & Precautions, Patient's Chart, lab work & pertinent test results  History of Anesthesia Complications Negative for: history of anesthetic complications  Airway Mallampati: II  TM Distance: >3 FB Neck ROM: Full    Dental no notable dental hx.    Pulmonary asthma , former smoker,    Pulmonary exam normal        Cardiovascular negative cardio ROS Normal cardiovascular exam     Neuro/Psych  Headaches, negative psych ROS   GI/Hepatic negative GI ROS, Neg liver ROS,   Endo/Other  negative endocrine ROS  Renal/GU negative Renal ROS  negative genitourinary   Musculoskeletal negative musculoskeletal ROS (+)   Abdominal   Peds  Hematology  (+) anemia ,   Anesthesia Other Findings Day of surgery medications reviewed with patient.  Reproductive/Obstetrics (+) Pregnancy                             Anesthesia Physical Anesthesia Plan  ASA: II  Anesthesia Plan: Epidural   Post-op Pain Management:    Induction:   PONV Risk Score and Plan: Treatment may vary due to age or medical condition  Airway Management Planned: Natural Airway  Additional Equipment:   Intra-op Plan:   Post-operative Plan:   Informed Consent: I have reviewed the patients History and Physical, chart, labs and discussed the procedure including the risks, benefits and alternatives for the proposed anesthesia with the patient or authorized representative who has indicated his/her understanding and acceptance.       Plan Discussed with:   Anesthesia Plan Comments:         Anesthesia Quick Evaluation

## 2019-07-21 NOTE — Progress Notes (Signed)
Subjective:   resting some between ctx - breathing through ctx some bloody show in bathroom doula and spouse at bedside  Objective:   VS: BP (!) 98/57   Pulse (!) 101   Temp 98.2 F (36.8 C) (Oral)   Resp 16   Ht 5' 2"  (1.575 m)   Wt 77 kg   SpO2 97%   BMI 31.04 kg/m  FHR : baseline 120 / variability moderate / accelerations accels / no decelerations Toco: contractions every 5 minutes / moderate   cervix : deferred exam  lateral positioning  Assessment /Plan:  Latent labor ~ 7hours s/p AROM FHR category 1 re-evaluate in next 2 hours for labor progression - consider augmentation if active labor not established   Artelia Laroche CNM, MSN, [facility] 07/21/2019, 4:24 AM

## 2019-07-21 NOTE — Progress Notes (Signed)
Subjective:   breathing with ctx utilized breast-pump / positioning / SB & rebozzo work past few hours without progression voices frustration and fatigue - seeking alternatives / options  Objective:   VS: BP 119/76   Pulse 82   Temp 97.6 F (36.4 C) (Oral)   Resp 18   Ht 5' 2"  (1.575 m)   Wt 77 kg   SpO2 97%   BMI 31.04 kg/m  FHR : baseline 120 / variability moderate / accelerations + / no decelerations Toco: contractions every 4-6 minutes / moderate   cervix : deferred   bedside sono: vtx / ROT / subjectively low fluid  membranes: clear  Assessment /Plan:  latent labor FHR category 1  Discussed pitocin augmentation with IV sedation option for rest while augmenting ctx Benefits and risks for expectant management, oral cytotec / pitocin reviewed.  Recommend pitocin as best option for labor augmentation with most control over outcome.  Need for rest while pitocin is initiated to allow rest and relaxation / positioning to facilitate fetal rotation.  Doula and spouse participated in discussion / questions answered and time allowed for private discussion and decision making.  Desires to proceed with pitocin augmentation and IV sedation but does not want narcotic medications. Contact pharmacy - will send single dose Nubain (26m) to floor to use per provider order to pharmacy. Pitocin low dose protocol - start at 25mmin and increase until adequate ctx.  Reposition far left lateral without pillow or peanut to facilitate fetal descent into mid-pelvis.  Reassess when ctx stronger for labor support  Dr FoPamala Hurrypdated   TaArtelia LarocheNM, MSN, FAChardon Surgery Center0/25/2020, 1:38 PM

## 2019-07-21 NOTE — Progress Notes (Signed)
Subjective:   breathing and moaning with ctx - reports rested well with Nubain for couple hours laboring on birth ball with doula and spouse at side for support painful ctx - no pressure  Objective:   VS: BP 132/63   Pulse 73   Temp 98.6 F (37 C)   Resp 18   Ht 5' 2"  (1.575 m)   Wt 77 kg   SpO2 97%   BMI 31.04 kg/m  FHR : baseline 120 / variability moderate / accelerations + / no  decelerations Toco: contractions every 2-3 minutes / pitocin 20m/min   cervix : deferred  Assessment /Plan:  active labor FHR category 1 expectant management - pitocin augmentation CNM labor support PRN   TArtelia LarocheCNM, MSN, F[facility]10/25/2020, 6:37 PM

## 2019-07-21 NOTE — Progress Notes (Signed)
Subjective:   reports feeling pressure - requests exam  Objective:   FHR : baseline 125 / variability moderate / accelerations + / no decelerations Toco: contractions every 2-3 minutes / pitocin 6 mu/min  cervix : mid-position 7cm / 90% / vtx +1 with some caput / direct OA  membranes: clear with show  Assessment /Plan:  active labor FHR category 1 expectant management with continuous labor support  Linda Barnett CNM, MSN, [facility] 07/21/2019, 7:15 PM

## 2019-07-21 NOTE — Progress Notes (Signed)
Subjective:   ctx really painful + nausea and vomiting requesting cervix check to consider epidural  Objective:   VS: BP 119/71   Pulse 87   Temp 98.2 F (36.8 C) (Oral)   Resp 18   Ht 5' 2"  (1.575 m)   Wt 77 kg   SpO2 97%   BMI 31.04 kg/m  FHR : baseline 125 / variability moderate / accelerations + / occasional variable decelerations Toco: contractions every 2-3 minutes / pitocin 6 mu/min   cervix : 8-9 / 95% / +1 with show  Assessment /Plan:  active labor FHR category 2  discussed cervix progression - requests epidural due to fatigue  Artelia Laroche CNM, MSN, [facility] 07/21/2019, 10:57 PM

## 2019-07-21 NOTE — Progress Notes (Signed)
Subjective:   breathing with ctx - no progression in pain or pattern  Objective:   VS: BP (!) 105/50   Pulse 89   Temp 98.8 F (37.1 C) (Oral)   Resp 16   Ht 5' 2"  (1.575 m)   Wt 77 kg   SpO2 97%   BMI 31.04 kg/m  FHR : baseline 120 / variability moderate / accelerations + / no decelerations Toco: contractions every 5-8 minutes / moderate   cervix: unchanged at 5cm / 90% / vtx 0 ROT   membranes: clear with scant brown discharge  Assessment /Plan:  latent labor FHR category 1  recommend augmentation since no progression into active labor discussed augmentation options desires to try ambulation and breast stim with doula prior to medication / wants to avoid pitocin if possible would consider oral cytotec as medication option  reassess status 1-2 hours  Artelia Laroche CNM, MSN, [facility] 07/21/2019, 6:17 AM

## 2019-07-21 NOTE — Anesthesia Procedure Notes (Signed)
Epidural Patient location during procedure: OB Start time: 07/21/2019 11:25 PM End time: 07/21/2019 11:28 PM  Staffing Anesthesiologist: Brennan Bailey, MD Performed: anesthesiologist   Preanesthetic Checklist Completed: patient identified, pre-op evaluation, timeout performed, IV checked, risks and benefits discussed and monitors and equipment checked  Epidural Patient position: sitting Prep: site prepped and draped and DuraPrep Patient monitoring: continuous pulse ox, blood pressure and heart rate Approach: midline Location: L3-L4 Injection technique: LOR air  Needle:  Needle type: Tuohy  Needle gauge: 17 G Needle length: 9 cm Needle insertion depth: 6 cm Catheter type: closed end flexible Catheter size: 19 Gauge Catheter at skin depth: 11 cm Test dose: negative and Other (1% lidocaine)  Assessment Events: blood not aspirated, injection not painful, no injection resistance, negative IV test and no paresthesia  Additional Notes Patient identified. Risks, benefits, and alternatives discussed with patient including but not limited to bleeding, infection, nerve damage, paralysis, failed block, incomplete pain control, headache, blood pressure changes, nausea, vomiting, reactions to medication, itching, and postpartum back pain. Confirmed with bedside nurse the patient's most recent platelet count. Confirmed with patient that they are not currently taking any anticoagulation, have any bleeding history, or any family history of bleeding disorders. Patient expressed understanding and wished to proceed. All questions were answered. Sterile technique was used throughout the entire procedure. Please see nursing notes for vital signs.   Crisp LOR on first pass. Test dose was given through epidural catheter and negative prior to continuing to dose epidural or start infusion. Warning signs of high block given to the patient including shortness of breath, tingling/numbness in hands,  complete motor block, or any concerning symptoms with instructions to call for help. Patient was given instructions on fall risk and not to get out of bed. All questions and concerns addressed with instructions to call with any issues or inadequate analgesia.  Reason for block:procedure for pain

## 2019-07-22 ENCOUNTER — Encounter (HOSPITAL_COMMUNITY): Payer: Self-pay | Admitting: *Deleted

## 2019-07-22 MED ORDER — BENZOCAINE-MENTHOL 20-0.5 % EX AERO: 1.0000 "application " | INHALATION_SPRAY | CUTANEOUS | Status: DC | PRN

## 2019-07-22 MED ORDER — ACETAMINOPHEN 325 MG PO TABS: 650.0000 mg | ORAL_TABLET | ORAL | Status: DC | PRN

## 2019-07-22 MED ORDER — WITCH HAZEL-GLYCERIN EX PADS: 1.0000 "application " | MEDICATED_PAD | CUTANEOUS | Status: DC | PRN

## 2019-07-22 MED ORDER — SIMETHICONE 80 MG PO CHEW: 80.0000 mg | CHEWABLE_TABLET | ORAL | Status: DC | PRN

## 2019-07-22 MED ORDER — COCONUT OIL OIL: 1.0000 "application " | TOPICAL_OIL | Status: DC | PRN

## 2019-07-22 MED ORDER — DIBUCAINE (PERIANAL) 1 % EX OINT
1.0000 "application " | TOPICAL_OINTMENT | CUTANEOUS | Status: DC | PRN
  Administered 2019-07-22: 1 via RECTAL
  Filled 2019-07-22: qty 28

## 2019-07-22 MED ORDER — METHOCARBAMOL 500 MG PO TABS
500.0000 mg | ORAL_TABLET | Freq: Three times a day (TID) | ORAL | Status: DC | PRN
  Filled 2019-07-22: qty 1

## 2019-07-22 MED ORDER — LACTATED RINGERS IV SOLN: 500.0000 mL | Freq: Once | INTRAVENOUS | Status: DC

## 2019-07-22 MED ORDER — SENNOSIDES-DOCUSATE SODIUM 8.6-50 MG PO TABS
2.0000 | ORAL_TABLET | ORAL | Status: DC
  Administered 2019-07-22 – 2019-07-23 (×2): 2 via ORAL
  Filled 2019-07-22 (×2): qty 2

## 2019-07-22 MED ORDER — IBUPROFEN 600 MG PO TABS
600.0000 mg | ORAL_TABLET | Freq: Four times a day (QID) | ORAL | Status: DC
  Administered 2019-07-22 – 2019-07-24 (×9): 600 mg via ORAL
  Filled 2019-07-22 (×9): qty 1

## 2019-07-22 NOTE — Progress Notes (Signed)
Subjective:   Pushing well with ctx Side lying best / most efficient  Objective:   VS: BP 118/63   Pulse 99   Temp 97.9 F (36.6 C) (Oral)   Resp 18   Ht 5' 2"  (1.575 m)   Wt 77 kg   SpO2 100%   BMI 31.04 kg/m  FHR : baseline 95-105 / variability moderate / accelerations + / variable decelerations Toco: contractions every 2-5 minutes / pitocin 12 mu/min  Posterior asynclintic after ~2 hr pushing / caput at +3 with bones at pubic arch Welchers maneuver x 3 ctx over 15 minutes then repositioned + fetal descent to caput crowning  Assessment /Plan:  Second stage of labor with prolonged labor - protracted dilation and descent FHR category 2 continue pushing with progressive descent at this time   Artelia Laroche CNM, MSN, [facility] 07/22/2019, 0930 AM

## 2019-07-22 NOTE — Progress Notes (Signed)
Subjective:   comfortable with epidural - no pain or pressure  Objective:   VS: BP 107/60   Pulse 91   Temp 98.3 F (36.8 C) (Oral)   Resp 16   Ht 5' 2"  (1.575 m)   Wt 77 kg   SpO2 98%   BMI 31.04 kg/m  FHR : baseline 100-110 since epidural / variability moderate / accelerations + / occasional variable with noted episodic late decels after epidural due to maternal hypotension   Toco: contractions every 2-6 minutes  / pitocin 19m /min   cervix : anterior rim / 100% / vtx +2 with caput  foley dark amber urine  Assessment /Plan:  Active labor FHR category 2  reposition far lateral with peanut IVF bolus 5064mfor hydration  reassess ~ 1-2 hour or with onset rectal pressure  TaArtelia LarocheNM, MSN, [facility]AKind[facility]0/26/2020, 3:05 AM

## 2019-07-22 NOTE — Progress Notes (Signed)
Subjective:   Some pressure with ctx peak Sitting up high fowlers  Objective:   VS: BP 133/74   Pulse (!) 105   Temp 98.4 F (36.9 C) (Oral)   Resp 16   Ht 5' 2"  (1.575 m)   Wt 77 kg   SpO2 100%   BMI 31.04 kg/m  FHR : baseline 100 / variability moderate / accelerations + / occasional variable decelerations Toco: contractions every 3-5 minutes / moderate to strong / pitocin 10 mu/min  cervix : 10 100 vtx + 2 with caput  Assessment /Plan:  active labor - second stage FHR category 2  Dr Benjie Karvonen updated with progress and FHR low baseline with intermittent maternal hypotension since epidural   Linda Barnett CNM, MSN, [facility] 07/22/2019, 6:59 AM

## 2019-07-22 NOTE — Lactation Note (Signed)
This note was copied from a baby's chart. Lactation Consultation Note  Patient Name: Linda Barnett JLLVD'I Date: 07/22/2019 Reason for consult: Initial assessment;Primapara;1st time breastfeeding;Early term 37-38.6wks  P1 mother whose infant is now 26 hours old.  This is an ETI at 38+6 weeks.  Baby was asleep STS on mother's chest when I arrived.  Mother stated that baby has breast fed twice since birth with the first feeding being more effective.  Mother feels she has latched well and denies pain with latching.  Mother's breasts are soft and non tender and nipples are short shafted and intact.  Since mother has latched well so far no tools given at this time.  Informed mother that if she begins to have difficulty with latching we can provide a manual pump and breast shells to help evert nipples.  Mother is exhausted at this time and has been in labor since Friday.    Encouraged to feed 8-12 times/24 hours or sooner if baby shows feeding cues.  Reviewed cues.  Mother is familiar with hand expression and was able to express colostrum drops earlier today.  Colostrum container provided and milk storage times reviewed.  Finger feeding demonstrated.  Spent considerable amount of time answering questions and reviewing breast feeding basics.    Mom made aware of O/P services, breastfeeding support groups, community resources, and our phone # for post-discharge questions. Discussed our OP options for follow up visits with an [place].  Mother seemed interested in considering this option.  Mother has a DEBP for home use and has brought this to the hospital.  Informed her that we do not have to set up a DEBP at this time.  We will continue to monitor baby's feeding and mother's ability to hand express.  Offered a manual pump if desired.  Father present and supportive.  Mother will call for latch assistance as needed.  RN updated.   Maternal Data Formula Feeding for Exclusion: No Has patient been taught Hand  Expression?: Yes Does the patient have breastfeeding experience prior to this delivery?: No  Feeding Feeding Type: Breast Fed  LATCH Score                   Interventions    Lactation Tools Discussed/Used WIC Program: No   Consult Status Consult Status: Follow-up Date: 07/23/19 Follow-up type: In-patient    Javonte Elenes R Quadir Muns 07/22/2019, 6:10 PM

## 2019-07-22 NOTE — Lactation Note (Signed)
This note was copied from a baby's chart. Lactation Consultation Note  Patient Name: Linda Barnett ATFTD'D Date: 07/22/2019 Reason for consult: Follow-up assessment;Mother's request;Difficult latch;Primapara;1st time breastfeeding;Early term 37-38.6wks  2111 - 2145 - I followed up with Linda Barnett upon request. We attempted to latch baby Linda Barnett in football hold on her left breast. She licked a bit, but she did not latch. Baby did not show strong readiness cues for latching at this visit, and when I allowed her to suckle on a gloved finger, I noted that she was uncoordinated. I helped Linda Barnett hand express colostrum in a spoon, which we fed to baby.   The RN (shadowing lactation) helped swaddle baby, and changed a meconium diaper. We swaddled baby, and she calmed down.  I brought Linda Barnett a manual pump with size 27 flange and educated on how to disassemble, clean and reassemble parts. I instructed her to pre-pump for stimulation and to help elongate nipples for latching.   The plan is to attempt to breast feed at least 8-12 times a day, and to wake baby if needed. If baby is too sleepy to latch, hand express and spoon or finger feed. If baby is eager to latch and Linda Barnett is having difficulty, please page for assistance.  Maternal Data Formula Feeding for Exclusion: No Has patient been taught Hand Expression?: Yes Does the patient have breastfeeding experience prior to this delivery?: No  Feeding    LATCH Score Latch: Too sleepy or reluctant, no latch achieved, no sucking elicited.  Audible Swallowing: None  Type of Nipple: Everted at rest and after stimulation  Comfort (Breast/Nipple): Soft / non-tender  Hold (Positioning): Assistance needed to correctly position infant at breast and maintain latch.  LATCH Score: 5  Interventions Interventions: Breast feeding basics reviewed;Assisted with latch;Skin to skin;Hand express;Pre-pump if needed;Adjust position;Support  pillows;Hand pump  Lactation Tools Discussed/Used WIC Program: No   Consult Status Consult Status: Follow-up Date: 07/23/19 Follow-up type: In-patient    Linda Barnett 07/22/2019, 9:50 PM

## 2019-07-23 LAB — CBC
HCT: 29.3 % — ABNORMAL LOW (ref 36.0–46.0)
Hemoglobin: 9.7 g/dL — ABNORMAL LOW (ref 12.0–15.0)
MCH: 30.5 pg (ref 26.0–34.0)
MCHC: 33.1 g/dL (ref 30.0–36.0)
MCV: 92.1 fL (ref 80.0–100.0)
Platelets: 153 10*3/uL (ref 150–400)
RBC: 3.18 MIL/uL — ABNORMAL LOW (ref 3.87–5.11)
RDW: 13.6 % (ref 11.5–15.5)
WBC: 15.9 10*3/uL — ABNORMAL HIGH (ref 4.0–10.5)
nRBC: 0 % (ref 0.0–0.2)

## 2019-07-23 MED ORDER — POLYSACCHARIDE IRON COMPLEX 150 MG PO CAPS
150.0000 mg | ORAL_CAPSULE | Freq: Every day | ORAL | Status: DC
  Administered 2019-07-23 – 2019-07-24 (×2): 150 mg via ORAL
  Filled 2019-07-23 (×2): qty 1

## 2019-07-23 MED ORDER — RHO D IMMUNE GLOBULIN 1500 UNIT/2ML IJ SOSY
300.0000 ug | PREFILLED_SYRINGE | Freq: Once | INTRAMUSCULAR | Status: AC
  Administered 2019-07-23: 300 ug via INTRAVENOUS
  Filled 2019-07-23: qty 2

## 2019-07-23 MED ORDER — MAGNESIUM OXIDE 400 (241.3 MG) MG PO TABS
400.0000 mg | ORAL_TABLET | Freq: Every day | ORAL | Status: DC
  Administered 2019-07-23 – 2019-07-24 (×2): 400 mg via ORAL
  Filled 2019-07-23 (×2): qty 1

## 2019-07-23 NOTE — Plan of Care (Signed)
Progressing appropriately. Encouraged to call for assistance as needed, and for [facility] assessment.

## 2019-07-23 NOTE — Lactation Note (Addendum)
This note was copied from a baby's chart. Lactation Consultation Note  Patient Name: Linda Barnett Date: 07/23/2019  P1, 75 hour female infant. Mom requesting [place] services. Per mom, she is very tired, LC swallowed infant so mom can get some rest. Per mom, infant had two episodes  of emesis and appears gassy. LC suggested mom to burp infant and do STS as much as possible. Infant will open mouth but not suckle at this time only holds breast in mouth and only suckled few minutes and stop with 20 mm NS. Infant still has uncoordinated suck , does not  suck well on a gloved finger.  LC had mom to hand express and infant was given 7 ml of colostrum by spoon. Mom will continue to work towards latching infant at breast and if infant is reluctant to breast fed mom will hand express and give infant back EBM. LC notice a little edema at breast mom was given breast shells to wear during the day and not to sleep in them at night. Mom knows to call Nurse or [place] if she has any further questions, concerns or need assistance with latching infant at breast.  Maternal Data    Feeding    LATCH Score                   Interventions    Lactation Tools Discussed/Used     Consult Status      Vicente Serene 07/23/2019, 3:41 AM

## 2019-07-23 NOTE — Progress Notes (Addendum)
PPD #1, SVD, intact, baby girl "Linda Barnett"  S:  Reports feeling tired and overwhelmed; reports some trouble with breastfeeding - baby is very sleepy and having some difficulty with latch             Tolerating po/ No nausea or vomiting / Denies dizziness or SOB             Bleeding is getting lighter             Pain controlled with Motrin             Up ad lib / ambulatory / voiding QS  Newborn breast feeding  O:               VS: BP 108/66 (BP Location: Right Arm)   Pulse 76   Temp 98.3 F (36.8 C) (Oral)   Resp 18   Ht 5' 2"  (1.575 m)   Wt 77 kg   SpO2 99%   Breastfeeding Unknown   BMI 31.04 kg/m    LABS:              Recent Labs    07/23/19 0555  WBC 15.9*  HGB 9.7*  PLT 153               Blood type: --/--/A NEG (10/27 0555)  Rubella: Immune (04/06 0000)                     I&O: Intake/Output      10/26 0701 - 10/27 0700 10/27 0701 - 10/28 0700   Urine (mL/kg/hr) 150 (0.1)    Blood 50    Total Output 200    Net -200                       Physical Exam:             Alert and oriented X3  Lungs: Clear and unlabored  Heart: regular rate and rhythm / no murmurs  Abdomen: soft, non-tender, non-distended              Fundus: firm, non-tender, U-1  Perineum: intact, no edema, no erythema, no ecchymosis  Lochia: scant rubra on pad   Extremities: +2 pitting LE edema, no calf pain or tenderness    A: PPD # 1, SVD  ABL Anemia    RH Negative, baby RH positive  Dependent edema  Doing well - stable status  P: Routine post partum orders  Niferex 169m PO daily  Magnesium oxide 401mPO daily   Give Rhogam today   Continue working with lactation  D/C IV after Rhogam  May shower today  Encouraged to rest when baby rests  TED hose bilaterally   Anticipate d/c home in AM  MeLars PinksMSN, CNColgate PalmoliveB/GYN & Infertility

## 2019-07-23 NOTE — Progress Notes (Signed)
Patient requests to not be bothered the rest of the night. She understands that we have to check that baby is in crib for safety, but we will not wake her up.

## 2019-07-23 NOTE — Anesthesia Postprocedure Evaluation (Signed)
Anesthesia Post Note  Patient: Linda Barnett  Procedure(s) Performed: AN AD HOC LABOR EPIDURAL     Patient location during evaluation: Mother Baby Anesthesia Type: Epidural Level of consciousness: awake and alert Pain management: pain level controlled Vital Signs Assessment: post-procedure vital signs reviewed and stable Respiratory status: spontaneous breathing, nonlabored ventilation and respiratory function stable Cardiovascular status: stable Postop Assessment: no headache, no backache, epidural receding, no apparent nausea or vomiting, patient able to bend at knees, adequate PO intake and able to ambulate Anesthetic complications: no    Last Vitals:  Vitals:   07/23/19 0100 07/23/19 0508  BP: 115/73 108/66  Pulse: 69 76  Resp: 18 18  Temp: 37 C 36.8 C  SpO2:      Last Pain:  Vitals:   07/23/19 0508  TempSrc: Oral  PainSc: 0-No pain   Pain Goal:                   [employer]

## 2019-07-23 NOTE — Lactation Note (Signed)
This note was copied from a baby's chart. Lactation Consultation Note  Patient Name: Linda Barnett BOMQT'T Date: 07/23/2019 Reason for consult: Follow-up assessment   LC Follow Up Visit:  Attempted to visit with mother, however, there was a quiet sign on her door.  Will return later today.  Consult Status Consult Status: Follow-up Date: 07/23/19 Follow-up type: In-patient    Alajah Witman R Fahima Cifelli 07/23/2019, 9:07 AM

## 2019-07-24 ENCOUNTER — Ambulatory Visit: Payer: Self-pay

## 2019-07-24 LAB — BPAM RBC
Blood Product Expiration Date: 202011272359
Blood Product Expiration Date: 202011282359
Unit Type and Rh: 600
Unit Type and Rh: 600

## 2019-07-24 LAB — RH IG WORKUP (INCLUDES ABO/RH)
ABO/RH(D): A NEG
Fetal Screen: NEGATIVE
Gestational Age(Wks): 38.6
Unit division: 0

## 2019-07-24 LAB — TYPE AND SCREEN
ABO/RH(D): A NEG
Antibody Screen: POSITIVE
Unit division: 0
Unit division: 0

## 2019-07-24 MED ORDER — MAGNESIUM OXIDE 400 (241.3 MG) MG PO TABS: 400.0000 mg | ORAL_TABLET | Freq: Every day | ORAL | Status: DC

## 2019-07-24 MED ORDER — IBUPROFEN 600 MG PO TABS: 600.0000 mg | ORAL_TABLET | Freq: Four times a day (QID) | ORAL | 0 refills | Status: DC

## 2019-07-24 MED ORDER — BENZOCAINE-MENTHOL 20-0.5 % EX AERO: 1.0000 "application " | INHALATION_SPRAY | CUTANEOUS | Status: DC | PRN

## 2019-07-24 MED ORDER — COCONUT OIL OIL: 1.0000 "application " | TOPICAL_OIL | 0 refills | Status: DC | PRN

## 2019-07-24 MED ORDER — ACETAMINOPHEN 500 MG PO TABS: 1000.0000 mg | ORAL_TABLET | Freq: Four times a day (QID) | ORAL | 0 refills | Status: DC | PRN

## 2019-07-24 MED ORDER — POLYSACCHARIDE IRON COMPLEX 150 MG PO CAPS: 150.0000 mg | ORAL_CAPSULE | Freq: Every day | ORAL | Status: DC

## 2019-07-24 NOTE — Lactation Note (Addendum)
This note was copied from a baby's chart. Lactation Consultation Note  Patient Name: Linda Barnett MBTDH'R Date: 07/24/2019 Reason for consult: Initial assessment;1st time breastfeeding;Difficult latch;Other (Comment);Early term 37-38.6wks(Using 24 mm NS past hx of edema at breast within first 24 hrs pp.) P1, 38 hour ETI infant currently cluster feeding , weight loss -3%. Tools: mom has 24 mm NS due to flat nipples, breast shells was advised to wear due to hx of edema in nipple tissue that is now resolved and DEBP given. Per mom, she has not been wearing  breast shells as previously advised. Mom was switched to 24 mm NS which is a better fit than 20 mm NS. Mom concern that her milk supply had dried up when  she used DEBP and did not see milk, mom has used DEBP twice. LC explained colostrum is thick and it is normal not to see breast milk in  DEBP at first but pumping  will help with breast stimulation and milk supply. LC encourage mom to keep pumping and try to pump 8 times within 24 hours. LC reviewed hand expression mom taught back and dad assisted with hand expression mom was pleased to see that she had colostrum. Mom will continue to work on learning how to hand express and dad is helping mom with hand expression Infant was fussy at first and appeared calmer after  mom gave infant 7 ml of colostrum by spoon. Parents understand infant is cluster feeding at this time. LC re-fitted mom with 24 mm NS and after a few attempts infant latched without difficulty and mom was still breastfeeding after 15 minutes when Loyal left room. Mom felt the 24 mm NS was a better fit and edema has resolved in breast tissue. LC encouraged mom to breastfeed according hunger cues and on demand. LC gave mom comfort gels to wear in her bra and explained how to use mom knows comfort gels are good for 6 days. Mom will not use comfort gels with butter cream that she has for sore nipples.  Mom plans to latch infant to  breast with 24 mm NS and hand express afterwards giving infant EBM and do STS with infant. Mom knows to call Nurse or [place] if she has any questions, concerns or need assistance with latching infant to breast.    Maternal Data Formula Feeding for Exclusion: No  Feeding Feeding Type: Breast Fed  LATCH Score Latch: Grasps breast easily, tongue down, lips flanged, rhythmical sucking.  Audible Swallowing: Spontaneous and intermittent  Type of Nipple: Flat  Comfort (Breast/Nipple): Soft / non-tender  Hold (Positioning): Assistance needed to correctly position infant at breast and maintain latch.  LATCH Score: 8  Interventions Interventions: Assisted with latch;Hand express;Breast compression;Adjust position;Support pillows;Position options;Expressed milk  Lactation Tools Discussed/Used     Consult Status Consult Status: Follow-up Date: 07/24/19 Follow-up type: In-patient    Vicente Serene 07/24/2019, 12:50 AM

## 2019-07-24 NOTE — Lactation Note (Addendum)
This note was copied from a baby's chart. Lactation Consultation Note Baby 39 hrs old. F/u for supplementation after BF d/t DPT. Mom is pumping colostrum and spoon feeding occassionally.  Encouraged to supplement every three hours after BF. Gave mom syring to give more volume.  Encouraged mom to massage breast occasionally during BF. Baby is cluster feeding. Stressed importance of pumping every 3 hrs and hand express after pumping to obtain more colostrum. Mom states understanding of how it will help lower bili levels. Praised mom for all her hard work. Discussed milk storage. Reviewed cluster feeding supply and demand. Mom states understanding.  Patient Name: Linda Barnett FQHKU'V Date: 07/24/2019 Reason for consult: Follow-up assessment;Hyperbilirubinemia;Primapara   Maternal Data    Feeding Feeding Type: Breast Fed  LATCH Score Latch: Grasps breast easily, tongue down, lips flanged, rhythmical sucking.  Audible Swallowing: Spontaneous and intermittent  Type of Nipple: Everted at rest and after stimulation  Comfort (Breast/Nipple): Soft / non-tender  Hold (Positioning): No assistance needed to correctly position infant at breast.  LATCH Score: 10  Interventions    Lactation Tools Discussed/Used Breast pump type: Double-Electric Breast Pump   Consult Status Consult Status: Follow-up Date: 07/25/19 Follow-up type: In-patient    Theodoro Kalata 07/24/2019, 11:25 PM

## 2019-07-24 NOTE — Lactation Note (Signed)
This note was copied from a baby's chart. Lactation Consultation Note  Patient Name: Linda Barnett XQKSK'S Date: 07/24/2019 Reason for consult: Follow-up assessment;Hyperbilirubinemia;Primapara;1st time breastfeeding;Difficult latch;Early term 37-38.6wks  LC in to visit with [name] at 29 hrs old, and at 6% weight loss.  Baby started latching with a 20 mm nipple shield on day 2, and size increased to 24 mm today.  Mom feeling much better about latching baby now.  Baby had just fed using the NS for 15 mins.  Baby swaddled in cradle hold.  Baby started stirring and cueing.    Offered to assist with latching without the nipple shield.  Mom's nipples are short shafted, but areola is very compressible.  After a couple attempts, baby was able to attain a deep latch to breast.  Swallows identified.    Baby's bilirubin elevated and MD wants to observe another day.  Another serum to be drawn at 5pm.  Parents are wanting to go home, and Mom even suggested supplementing with formula to help with bilirubin.  Mom states she hasn't pumped since yesterday, parts packed up and in car.  Encouraged FOB to retrieve pump parts.  Encouraged Mom to pump first before offering formula.  Plan- 1- Keep baby STS as much as possible 2- Awaken baby well every 3 hrs, or feed her sooner if she acts hungry 3- Try latching without nipple shield first.  Use alternate breast compression (taught this to Mom) during feeding 4- Pump both breasts 15-20 mins, using breast massage and hand expression to offer baby her colostrum.   Feeding Feeding Type: Breast Fed  LATCH Score Latch: Grasps breast easily, tongue down, lips flanged, rhythmical sucking.  Audible Swallowing: Spontaneous and intermittent  Type of Nipple: Everted at rest and after stimulation(short nipple shaft)  Comfort (Breast/Nipple): Soft / non-tender  Hold (Positioning): Assistance needed to correctly position infant at breast and maintain  latch.  LATCH Score: 9  Interventions Interventions: Breast feeding basics reviewed;Assisted with latch;Skin to skin;Breast massage;Hand express;Pre-pump if needed;Breast compression;Adjust position;Support pillows;Position options;Expressed milk;Hand pump;DEBP  Lactation Tools Discussed/Used Tools: Shells;Pump Nipple shield size: 24 Shell Type: Inverted Breast pump type: Double-Electric Breast Pump;Manual   Consult Status Consult Status: Follow-up Date: 07/25/19 Follow-up type: In-patient    Linda Barnett 07/24/2019, 2:12 PM

## 2019-07-24 NOTE — Discharge Summary (Signed)
Obstetric Discharge Summary Reason for Admission: induction of labor and oligo, AMA Prenatal Procedures: NST and ultrasound Intrapartum Procedures: spontaneous vaginal delivery and epidural, cytotec, foley balloon Postpartum Procedures: none Complications-Operative and Postpartum: none Hemoglobin  Date Value Ref Range Status  07/23/2019 9.7 (L) 12.0 - 15.0 g/dL Final   HCT  Date Value Ref Range Status  07/23/2019 29.3 (L) 36.0 - 46.0 % Final    Physical Exam:  General: alert, cooperative and no distress Lochia: appropriate Uterine Fundus: firm Incision: NA DVT Evaluation: No cords or calf tenderness. No significant calf/ankle edema.  Discharge Diagnoses: Principal Problem:   Postpartum care following vaginal delivery (10/26) Active Problems:   Encounter for induction of labor   Prolonged latent phase of labor   SVD (spontaneous vaginal delivery)    Discharge Information: Date: 07/24/2019 Activity: pelvic rest Diet: routine Medications:  Allergies as of 07/24/2019   No Known Allergies     Medication List    TAKE these medications   acetaminophen 500 MG tablet Commonly known as: Tylenol Take 2 tablets (1,000 mg total) by mouth every 6 (six) hours as needed (for pain scale < 4).   benzocaine-Menthol 20-0.5 % Aero Commonly known as: DERMOPLAST Apply 1 application topically as needed for irritation (perineal discomfort).   coconut oil Oil Apply 1 application topically as needed.   fluticasone 50 MCG/ACT nasal spray Commonly known as: FLONASE Place 1 spray into both nostrils daily as needed for allergies or rhinitis.   ibuprofen 600 MG tablet Commonly known as: ADVIL Take 1 tablet (600 mg total) by mouth every 6 (six) hours.   iron polysaccharides 150 MG capsule Commonly known as: NIFEREX Take 1 capsule (150 mg total) by mouth daily.   magnesium oxide 400 (241.3 Mg) MG tablet Commonly known as: MAG-OX Take 1 tablet (400 mg total) by mouth daily.    PRENATAL VITAMIN PO Take by mouth daily.            Discharge Care Instructions  (From admission, onward)         Start     Ordered   07/24/19 0000  Discharge wound care:    Comments: Sitz baths 2 times /day with warm water x 1 week. May add herbals: 1 ounce dried comfrey leaf* 1 ounce calendula flowers 1 ounce lavender flowers 1/2 ounce dried uva ursi leaves 1/2 ounce witch hazel blossoms (if you can find them) 1/2 ounce dried sage leaf 1/2 cup sea salt Directions: Bring 2 quarts of water to a boil. Turn off heat, and place 1 ounce (approximately 1 large handful) of the above mixed herbs (not the salt) into the pot. Steep, covered, for 30 minutes.  Strain the liquid well with a fine mesh strainer, and discard the herb material. Add 2 quarts of liquid to the tub, along with the 1/2 cup of salt. This medicinal liquid can also be made into compresses and peri-rinses.   07/24/19 0851         Condition: stable Instructions: refer to practice specific booklet Discharge to: home Follow-up Information    Artelia Laroche, CNM. Schedule an appointment as soon as possible for a visit in 6 week(s).   Specialty: Obstetrics and Gynecology Contact information: [address] [place] [address] [phone]           Newborn Data: Live born female Gay Birth Weight: 7 lb 12.2 oz (3520 g) APGAR: 42, 9  Newborn Delivery   Birth date/time: 07/[birth year]/2019 10:21:00 Delivery type: Vaginal, Spontaneous  Home with mother.  Linda Barnett, CNM 07/24/2019, 8:52 AM

## 2019-07-25 ENCOUNTER — Ambulatory Visit: Payer: Self-pay

## 2019-07-25 NOTE — Lactation Note (Signed)
This note was copied from a baby's chart. Lactation Consultation Note  Patient Name: Linda Barnett IOMBT'D Date: 07/25/2019 Reason for consult: Hyperbilirubinemia;Early term 37-38.6wks Baby is 71 hours old/7% weight loss.  Baby remains receiving phototherapy but bilirubin is coming down.  Father states they need to leave today because things are happening he doesn't like.  Mom states feedings are going well and baby has been cluster feeding.  She is no longer using a nipple shield.  She post pumps some and obtains a small amount of colostrum.  Discussed milk coming to volume and the prevention and treatment of engorgement.  Mom has a breast pump at home.  Nipples a little sore.  She has comfort gels.  Observed mom position baby at breast in cradle hold.  Instructed to use cross cradle for better control of head.  Reviewed waking techniques and breast massage.  Baby latched easily and well.  Discussed outpatient services and encouraged to call prn.  Maternal Data    Feeding Feeding Type: Breast Fed  LATCH Score Latch: Grasps breast easily, tongue down, lips flanged, rhythmical sucking.  Audible Swallowing: A few with stimulation  Type of Nipple: Everted at rest and after stimulation  Comfort (Breast/Nipple): Filling, red/small blisters or bruises, mild/mod discomfort  Hold (Positioning): Assistance needed to correctly position infant at breast and maintain latch.  LATCH Score: 7  Interventions Interventions: Assisted with latch;Adjust position;Support pillows;Skin to skin;Position options;Breast massage  Lactation Tools Discussed/Used     Consult Status Consult Status: Complete Follow-up type: Call as needed    Ave Filter 07/25/2019, 9:50 AM

## 2019-07-30 ENCOUNTER — Inpatient Hospital Stay (HOSPITAL_COMMUNITY): Admission: AD | Admit: 2019-07-30 | Payer: BC Managed Care – PPO | Source: Home / Self Care

## 2019-08-14 DIAGNOSIS — F4321 Adjustment disorder with depressed mood: Secondary | ICD-10-CM | POA: Diagnosis not present

## 2019-09-02 DIAGNOSIS — Z30011 Encounter for initial prescription of contraceptive pills: Secondary | ICD-10-CM | POA: Diagnosis not present

## 2019-09-11 DIAGNOSIS — F4321 Adjustment disorder with depressed mood: Secondary | ICD-10-CM | POA: Diagnosis not present

## 2019-09-24 ENCOUNTER — Telehealth (HOSPITAL_COMMUNITY): Payer: Self-pay | Admitting: Lactation Services

## 2019-10-09 ENCOUNTER — Ambulatory Visit: Payer: Self-pay

## 2019-10-09 NOTE — Lactation Note (Signed)
This note was copied from a baby's chart. Lactation Consultation Note  Patient Name: Linda Barnett Today's Date: 10/09/2019     10/09/2019  Name: Linda Barnett MRN: [id number] Date of Birth: 07/22/2021 Gestational Age: Gestational Age: 92w6dBirth Weight: 124.2 oz Weight today:  Weight: 10 lb 8.5 oz (44914g)   242month old infant presents today with mom for feeding assessment. Mom reports at infants 2 month appointment she had only gained 1/2 pound. Infant is sleepy at the breast. Mom concerned with milk supply. Infant is now being supplemented with 8- 10 ounces of formula a day. She takes 1-2 ounces to up to 4 ounces later in the evening.   Infant would feed often prior to supplementing and would get more frustrated at the breast. Infant is now feeding every 2-3 hours and is then supplemented. Infant wants to take bottles more in the afternoon. Infant feeds about 3-5 hours and mom will nurse her at night.   Mom is starting a bedtime routine to put infant asleep in her crib. Last night she slept in the crib for about 1 hour prior to awakening again.   Mom reports her milk took a week to come in. She has weight gain issues at that time. From 2 weeks to 1 months weight gain was good. Mom reports infant is happier since getting supplement. Mom reports she does feel fuller in the morning.   Infant is using the Lansinoh bottle for supplementing. Infant with minimal choking or drooling on the bottle. Mom reports infant does not spit up much, she is "super" gassy. Infant does not like a pacifier.   Infant with thick labial frenulum noted. Upper lip flanges pretty well with flanging. Mom with no pain with feeding. Infant with posterior lingual frenulum noted. Infant with weak suck on gloved finger. Infant with tongue thrusting on gloved finger. Infant with hump to back of tongue with suckling. She has good tongue extension and lateralization. Infant with decreased mid tongue elevation.  Infant tires easily with feeding and sleeps a lot at the breast.  Mom did have nipple pain in the beginning. Mom given information on tongue and lip restrictions and how they can effect milk supply and transfer. Mom given website information and local provider list.   Infant did latch to the breast. She was sleepy at the breast. Infant did not transfer well at the breast. Mom's milk supply is decreased. Reviewed stimulating infant to keep her awake and massaging breast with feeding as well as ways to increase her supply to help infant.   Mom is not pumping as she found it difficult. Mom reports she thought she may power pump. Reviewed pumping until infant is able to transfer milk on her own.   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 She did not produce milk during that time.   Mom has tried Mother's Milk Tea. She is concerned with milk supply decreasing with Fenugreek. She is eating oatmeal and drinking Coconut water. She feels like she is eating well and drinking plenty of fluids. Mom given information on Fenugreek, Legendairy Milk Products, and Reglan. Reviewed side effects and to speak with OB prior to taking any supplements. Mom given handout and dosages for Fenugreek and Reglan.   Infant to follow up with Dr. CCarlis Abbotttomorrow for weight check. Infant to follow up with Lactation in 2 weeks. Enc mom to have infant seen by oral specialist.   Mom to return  to work on Feb 1st. She will work from home and infant may be there or at daycare.     General Information: Mother's reason for visit: Feeding assessment, weigh gain issues Consult: Initial Lactation consultant: Nonah Mattes RN,IBCLC Breastfeeding experience: latching with each feeding, now supplementing due to weight gain issues Maternal medical conditions: Other(Prolactinemia) Maternal medications: Pre-natal vitamin, Iron, Other(Ca, Mg stopped in the last week, OCP's Progesterone only)  Breastfeeding  History: Frequency of breast feeding: every 2-3 hours Duration of feeding: 30 minutes, not always active  Supplementation: Supplement method: bottle(Lansinoh) Brand: Similac Formula volume: 1-4 ounces Formula frequency: 2-4 times a day Total formula volume per day: 8-10 ounces       Pump type: Spectra(S2) Pump frequency: not currently pumping-Occasionally in the morning Pump volume: .5-1.5  Infant Output Assessment: Voids per 24 hours: 8+ Urine color: Clear yellow Stools per 24 hours: every day since formula supplementation- wsa every 3-4 days Stool color: Yellow  Breast Assessment: Breast: Soft, Compressible, Hyperplasia Nipple: Erect Pain level: 0 Pain interventions: Bra, Breast pump  Feeding Assessment: Infant oral assessment: Variance Infant oral assessment comment: see note Positioning: Cross cradle(right breast, 15 minutes) Latch: 1 - Repeated attempts needed to sustain latch, nipple held in mouth throughout feeding, stimulation needed to elicit sucking reflex. Audible swallowing: 1 - A few with stimulation Type of nipple: 2 - Everted at rest and after stimulation Comfort: 2 - Soft/non-tender Hold: 2 - No assistance needed to correctly position infant at breast LATCH score: 8 Latch assessment: Deep Lips flanged: Yes Suck assessment: Displays both   Pre-feed weight: 4776 grams Post feed weight: 4788 grams Amount transferred: 12 ml Amount supplemented: 0  Additional Feeding Assessment: Infant oral assessment: Variance Infant oral assessment comment: see note   Latch: 0 - Too sleepy or reluctant, no latch achieved, no sucking elicited. Audible swallowing: 0 - None Type of nipple: 2 - Everted at rest and after stimulation Comfort: 2 - Soft/non-tender Hold: 2 - No assistance needed to correctly position infant at breast LATCH score: 6 Latch assessment: Deep Lips flanged: Yes Suck assessment: Displays both   Pre-feed weight: 4788 grams Post feed weight:  4788 grams      Totals: Total amount transferred: 12 ml Total supplement given: 0-momto supplement Total amount pumped post feed: did not pump   Plan:  1. Offer the breast with feeding cues 2. Feed infant on both breasts with each feeding 3. Empty the first breast before offering the second breast 4. Massage/compress the breast with feeding 5. Offer infant a bottle of pumped breast milk or formula after breast feeding, offer her as much as she would like to take 6. Feed infant using the paced bottle feeding method (video on [email or web address].[email or web address]) 7. Infant needs about 88-118 ml (3-4 ounces) for 8 feedings a day or 705-940 ml (24-31 ounces) in 24 hours. Infant may take more or less depending on how often she feeds. Feed infant until she is satisfied.  8. Would recommend that you pump 4-6 times a day for 15-20 minutes to promote and protect milk supply. All milk should be given back to infant. Pump anytime infant is getting a bottle for sure.  9. Power pumping- pump for 20 minutes, rest for 20, pump for 10, rest for 10, pump for 10 minutes 10. Fenugreek 1200-1800 mg 3 times a day may be helpful for milk or Reglan 10 mg 3 times a day for 10-30 days can be helpful. Please call OB if you would like  to pursue.  11. Keep up the good work 33. Thank you for allowing me to assist you today 13. Please call with any questions or concerns as needed [phone] 14. Follow up with Lactation in 2 weeks or 1-5 days post tongue and lip releases if completed  [facility] RN, IBCLC                                                     Debby Freiberg Iain Sawchuk 10/09/2019, 8:22 AM

## 2019-10-31 ENCOUNTER — Ambulatory Visit: Payer: Self-pay

## 2019-10-31 NOTE — Lactation Note (Signed)
This note was copied from a baby's chart. Lactation Consultation Note  Patient Name: Linda Barnett [id number] Date: 10/31/2019     10/31/2019  Name: Linda Barnett MRN: [id number] Date of Birth: 07/22/2021 Gestational Age: Gestational Age: 26w6dBirth Weight: 124.2 oz Weight today:  Weight: 11 lb 6.7 oz (52064g)   361month old infant presents today with mom for follow up feeding assessment.   Infant has gained 404 grams in the last 22 days with an average daily weight gain of 22 grams a day.    Infant is BF in the evening and overnight and in the am. She is being supplemented with formula and EBM as needed. Infant has started daycare this week and is not nursing as much during the day.   Infant will nurse for about 20 minutes on one or both breasts per feeding. Mom always offers the second. Infant is being supplemented with 1 bottle in the evening. Infant is getting formula at day care. Infant is at home in the morning with Grandmother and is getting the pumped breast milk at that time.   Infant with thick labial frenulum noted. Upper lip flanges pretty well with flanging. Mom with no pain with feeding. Infant with posterior lingual frenulum noted. Infant with weak suck on gloved finger. Infant with tongue thrusting on gloved finger. Infant with hump to back of tongue with suckling. She has good tongue extension and lateralization. Infant with decreased mid tongue elevation. Infant tires easily with feeding and sleeps a lot at the breast.Mom reports infant was evaluated by Dr. HAudie Pintoand parents decided not to have infant tongue and lip released. 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     Mom is pumping 3 times in 8 hours at work and gets 8 ounces total. She reports she went back to work last week and is not sure if her supply has been effected. The most the can generally pump is 3 ounces at a time, it is usually less than 2 ounces per pumping. Mom is sometimes trying to pump at night before  bed.   Infant fussy at the breast today. She has a clear runny nose with some stuffiness. Mom did well giving her breaks as needed. Infant did not nurse well in the office.   Mom reports she is concerned with her milk supply. Reviewed Fenugreek, mom reports she was concerned it would decrease her supply. Reviewed Legendairy Milk Products. Mom is putting a lot of effort into increasing her milk supply. Reviewed that her age and her history of Prolactinemia is effecting her supply. She reports she did not make milk when her Prolactin levels were elevated. Mom plans to call and make appt with her Midwife.   Infant to follow up with Dr. CCarlis Abbottat 4 months. Infant to follow up with Lactation as needed. Mom to call with questions or concerns as needed.    General Information: Mother's reason for visit: Follow up feeding assessment Consult: Follow-up Lactation consultant: SNonah MattesRN,IBCLC Breastfeeding experience: Nursing when mom is home, supplementing when infant is at day care and mom at work and in the evening Maternal medical conditions: Other(Hyper Prolactanemia 2 years ago) Maternal medications: Pre-natal vitamin, Iron, Other(OCP-Progsterone only)  Breastfeeding History: Frequency of breast feeding: every 2-3 hours when mom is at home Duration of feeding: 20 minutes  Supplementation: Supplement method: bottle(Lansinoh) Brand: Similac Formula volume: 2-4 ounces Formula frequency: 4 times a day   Breast milk volume: 2-4 ounces Breast milk frequency:  2-3 x a day   Pump type: Spectra Pump frequency: 3 times a day Pump volume: 8 ounces total  Infant Output Assessment: Voids per 24 hours: 8 Urine color: Clear yellow Stools per 24 hours: 1 Stool color: Yellow  Breast Assessment: Breast: Soft, Compressible Nipple: Erect Pain level: 1(Right nipple with some soreness, has been off and on) Pain interventions: Bra, Breast pump, Expressed breast milk, Other(Nipple Butter)  Feeding  Assessment: Infant oral assessment: Variance Infant oral assessment comment: see note Positioning: Cradle(left and right breast, off and on for 15 minutes) Latch: 1 - Repeated attempts needed to sustain latch, nipple held in mouth throughout feeding, stimulation needed to elicit sucking reflex. Audible swallowing: 2 - Spontaneous and intermittent Type of nipple: 2 - Everted at rest and after stimulation Comfort: 1 - Filling, red/small blisters or bruises, mild/mod discomfort Hold: 2 - No assistance needed to correctly position infant at breast LATCH score: 8 Latch assessment: Deep Lips flanged: Yes Suck assessment: Displays both   Pre-feed weight: 5180 grams Post feed weight: 5192 grams Amount transferred: 12 ml Amount supplemented: 0  Additional Feeding Assessment: Infant oral assessment: Variance Infant oral assessment comment: see note Positioning: Cradle Latch: 1 - Repeated attempts neede to sustain latch, nipple held in mouth throughout feeding, stimulation needed to elicit sucking reflex. Audible swallowing: 2 - Spontaneous and intermittent Type of nipple: 2 - Everted at rest and after stimulation Comfort: 1 - Filling, red/small blisters or bruises, mild/mod discomfort Hold: 2 - No assistance needed to correctly position infant at breast LATCH score: 8 Latch assessment: Deep Lips flanged: Yes Suck assessment: Displays both   Pre-feed weight: 5308 grams with clothes on Post feed weight: did not reweigh      Totals: Total amount transferred: 12 ml Total supplement given: nursed a little longer, fussy today Total amount pumped post feed: did not pump   Plan:  1. Offer the breast with feeding cues when mom is home with infant 2. Feed infant on both breasts with each feeding 3. Empty the first breast before offering the second breast 4. Massage/compress the breast with feeding 5. Offer infant a bottle of pumped breast milk or formula after breast feeding, offer her as  much as she would like to take 6. Feed infant using the paced bottle feeding method (video on [email or web address].[email or web address]) 7. Infant needs about 96-128 ml (3-4 ounces) for 8 feedings a day or [phone] ml (26-34 ounces) in 24 hours. Infant may take more or less depending on how often she feeds. Feed infant until she is satisfied.  8. Would recommend that you pump 4-6 times a day for 15-20 minutes to promote and protect milk supply. All milk should be given back to infant. Pump anytime infant is getting a bottle for sure.  9. Power pumping- pump for 20 minutes, rest for 20, pump for 10, rest for 10, pump for 10 minutes 10. Fenugreek 1200-1800 mg 3 times a day may be helpful for milk  11.  Reglan 10 mg 3 times a day for 10-30 days can be helpful. Please call OB if you would like to pursue.  12. Lengendairy Milk products are Fenugreek Free and have been helpful for some mom's to increase their supply. Their website [email or web address] 13. Keep up the good work 38. Thank you for allowing me to assist you today 15. Please call with any questions or concerns as needed [phone] 16. Follow up with Lactation as needed   Donn Pierini RN, [employer]  Debby Freiberg Sajad Glander 10/31/2019, 8:17 AM

## 2020-03-19 ENCOUNTER — Other Ambulatory Visit: Payer: Self-pay | Admitting: Endocrinology

## 2020-03-19 DIAGNOSIS — N926 Irregular menstruation, unspecified: Secondary | ICD-10-CM

## 2020-03-19 DIAGNOSIS — E221 Hyperprolactinemia: Secondary | ICD-10-CM

## 2020-03-26 ENCOUNTER — Telehealth (INDEPENDENT_AMBULATORY_CARE_PROVIDER_SITE_OTHER): Payer: BC Managed Care – PPO | Admitting: Family Medicine

## 2020-03-26 ENCOUNTER — Other Ambulatory Visit: Payer: Self-pay

## 2020-03-26 DIAGNOSIS — Z20818 Contact with and (suspected) exposure to other bacterial communicable diseases: Secondary | ICD-10-CM

## 2020-03-26 DIAGNOSIS — J029 Acute pharyngitis, unspecified: Secondary | ICD-10-CM | POA: Diagnosis not present

## 2020-03-26 MED ORDER — AMOXICILLIN 500 MG PO CAPS: 500.0000 mg | ORAL_CAPSULE | Freq: Two times a day (BID) | ORAL | 0 refills | Status: DC

## 2020-03-26 NOTE — Patient Instructions (Signed)
-  I sent the medication(s) we discussed to your pharmacy: Meds ordered this encounter  Medications  . amoxicillin (AMOXIL) 500 MG capsule    Sig: Take 1 capsule (500 mg total) by mouth 2 (two) times daily.    Dispense:  20 capsule    Refill:  0    Please let us know if you have any questions or concerns regarding this prescription.  I hope you are feeling better soon! Seek care promptly if your symptoms worsen, new concerns arise or you are not improving with treatment.

## 2020-03-26 NOTE — Progress Notes (Signed)
Virtual Visit via Video Note  I connected with Linda Barnett  on 03/26/20 at  6:20 PM EDT by a video enabled telemedicine application and verified that I am speaking with the correct person using two identifiers.  Location patient: home, [place] Location provider:work or home office Persons participating in the virtual visit: patient, provider  I discussed the limitations of evaluation and management by telemedicine and the availability of in person appointments. The patient expressed understanding and agreed to proceed.   HPI:  Acute visit for a sore throat: -started about 3 days ago -symptoms:  sore throat, tired, swollen glands in throat, mildly upset stomach -daughter was diagnosed with strep throat today at pediatrician office, she is in daycare -daughter was tested and was negative for COVID, flu, RSV  -she has had a mild stuffy nose but she does have some seasonal allergies -denies fevers, difficulty swallowing or breathing, diarrhea, vomiting -pt is fully vaccinated for covid19    ROS: See pertinent positives and negatives per HPI.  Past Medical History:  Diagnosis Date  . Allergy   . Asthma    childhood  . Complication of anesthesia    pt states after having her wisdom teeth out it took a long time for her to wake up from anesthesia   . Frequent headaches   . Genital warts   . History of chicken pox     Past Surgical History:  Procedure Laterality Date  . WISDOM TOOTH EXTRACTION      Family History  Problem Relation Age of Onset  . Hypothyroidism Mother   . Other Mother        pre-diabetic  . Diabetes Mother   . Alcohol abuse Father   . Other Father        substance abuse  . Arthritis Maternal Grandmother   . Breast cancer Maternal Grandmother 97  . Diabetes Maternal Grandmother   . Dementia Maternal Grandmother 43  . Cancer Maternal Grandmother   . Thyroid disease Sister   . Gout Brother   . Healthy Maternal Grandfather   . ALS Paternal Grandmother         passed in car accident  . Dementia Paternal Grandfather 45  . Hypothyroidism Maternal Aunt   . Hyperthyroidism Maternal Uncle   . Other Sister        ?fibroids; had ablation    SOCIAL HX: see hpi   Current Outpatient Medications:  .  acetaminophen (TYLENOL) 500 MG tablet, Take 2 tablets (1,000 mg total) by mouth every 6 (six) hours as needed (for pain scale < 4)., Disp: 30 tablet, Rfl: 0 .  amoxicillin (AMOXIL) 500 MG capsule, Take 1 capsule (500 mg total) by mouth 2 (two) times daily., Disp: 20 capsule, Rfl: 0 .  benzocaine-Menthol (DERMOPLAST) 20-0.5 % AERO, Apply 1 application topically as needed for irritation (perineal discomfort)., Disp:  , Rfl:  .  coconut oil OIL, Apply 1 application topically as needed., Disp:  , Rfl: 0 .  fluticasone (FLONASE) 50 MCG/ACT nasal spray, Place 1 spray into both nostrils daily as needed for allergies or rhinitis., Disp: , Rfl:  .  ibuprofen (ADVIL) 600 MG tablet, Take 1 tablet (600 mg total) by mouth every 6 (six) hours., Disp: 30 tablet, Rfl: 0 .  iron polysaccharides (NIFEREX) 150 MG capsule, Take 1 capsule (150 mg total) by mouth daily., Disp:  , Rfl:  .  magnesium oxide (MAG-OX) 400 (241.3 Mg) MG tablet, Take 1 tablet (400 mg total) by mouth daily., Disp:  ,  Rfl:  .  Prenatal Vit-Fe Fumarate-FA (PRENATAL VITAMIN PO), Take by mouth daily., Disp: , Rfl:   EXAM:  VITALS per patient if applicable:  GENERAL: alert, oriented, appears well and in no acute distress  HEENT: atraumatic, conjunttiva clear, no obvious abnormalities on inspection of external nose and ears, erythema post oropharynx on limited video visit exam  NECK: normal movements of the head and neck  LUNGS: on inspection no signs of respiratory distress, breathing rate appears normal, no obvious gross SOB, gasping or wheezing  CV: no obvious cyanosis  MS: moves all visible extremities without noticeable abnormality  PSYCH/NEURO: pleasant and cooperative, no obvious depression or  anxiety, speech and thought processing grossly intact  ASSESSMENT AND PLAN:  Discussed the following assessment and plan:  Sore throat  Streptococcus exposure  -we discussed possible serious and likely etiologies, options for evaluation and workup, limitations of telemedicine visit vs in person visit, treatment, treatment risks and precautions. Pt prefers to treat via telemedicine empirically rather then risking or undertaking an in person visit at this moment. Given classic symptoms and exposure to confirmed strep patient opted for empiric treatment for presumed strep pharyngitis with amoxicillin 54m bid x 10 days. Discussed side effects, reasons for treatment, potential complications, alt diagnosis and return/care precautions. Patient agrees to seek prompt in person care if worsening, new symptoms arise, or if is not improving with treatment.   I discussed the assessment and treatment plan with the patient. The patient was provided an opportunity to ask questions and all were answered. The patient agreed with the plan and demonstrated an understanding of the instructions.   The patient was advised to call back or seek an in-person evaluation if the symptoms worsen or if the condition fails to improve as anticipated.   HLucretia Kern DO

## 2020-04-09 ENCOUNTER — Other Ambulatory Visit (INDEPENDENT_AMBULATORY_CARE_PROVIDER_SITE_OTHER): Payer: BC Managed Care – PPO

## 2020-04-09 ENCOUNTER — Other Ambulatory Visit: Payer: Self-pay

## 2020-04-09 DIAGNOSIS — E221 Hyperprolactinemia: Secondary | ICD-10-CM

## 2020-04-09 DIAGNOSIS — N926 Irregular menstruation, unspecified: Secondary | ICD-10-CM

## 2020-04-13 ENCOUNTER — Ambulatory Visit: Payer: BC Managed Care – PPO | Admitting: Endocrinology

## 2020-04-13 ENCOUNTER — Other Ambulatory Visit: Payer: Self-pay

## 2020-04-13 ENCOUNTER — Encounter: Payer: Self-pay | Admitting: Endocrinology

## 2020-04-13 VITALS — BP 118/76 | HR 60 | Wt 151.8 lb

## 2020-04-13 DIAGNOSIS — E221 Hyperprolactinemia: Secondary | ICD-10-CM

## 2020-04-13 NOTE — Progress Notes (Signed)
Patient ID: Linda Barnett, female   DOB: 10-Jun-1976, 44 y.o.   MRN: [id number]           Chief complaint: Follow-up of high prolactin   History of Present Illness  Background history: She had a missed menstrual cycle in May 2018 prior to her visit  with her gynecologist  Her prolactin was tested and was increased in 30s Because of her potential desire for pregnancy she was recommended cabergoline in 04/2017 However patient was not wanting to start this and may have taken only 1 tablet She did have fairly regular menstrual cycles on her own subsequently  Previous prolactin levels: June 2018: 37.6 and 32 within a few days apart, the second level was done fasting in the morning  Recent history:  In 3/20 her prolactin level has increased with the pregnancy She had a normal delivery in 06/2019 She thinks that she did not have enough breastmilk when she was breast-feeding  Her only complaint is decreased libido She does not notice any milky breast discharge  She does not complain of any headaches Her menstrual cycles started back in January although initially she skipped a cycle but subsequently they were normal She is now on birth control pill for the last month  Her prolactin level is now 36, previously 57  Previous studies: To evaluate her hyperprolactinemia the macroprolactin was evaluated and was found to be only 16% Previously had an estradiol level done which was drawn the day before her menstrual cycle started and the level was low normal for the luteal phase at 42  MRI of pituitary gland in 04/2017 shows no pituitary abnormality  Prolactin levels:   Lab Results  Component Value Date   PROLACTIN 35.8 (H) 04/09/2020   PROLACTIN 95.1 (H) 12/13/2018   PROLACTIN 57.1 (H) 03/16/2018   PROLACTIN 44.9 (H) 12/11/2017      No results found for: TSH, FREET4   Allergies as of 04/13/2020   No Known Allergies     Medication List       Accurate as of April 13, 2020  4:06  PM. If you have any questions, ask your nurse or doctor.        acetaminophen 500 MG tablet Commonly known as: Tylenol Take 2 tablets (1,000 mg total) by mouth every 6 (six) hours as needed (for pain scale < 4).   amoxicillin 500 MG capsule Commonly known as: AMOXIL Take 1 capsule (500 mg total) by mouth 2 (two) times daily.   benzocaine-Menthol 20-0.5 % Aero Commonly known as: DERMOPLAST Apply 1 application topically as needed for irritation (perineal discomfort).   coconut oil Oil Apply 1 application topically as needed.   fluticasone 50 MCG/ACT nasal spray Commonly known as: FLONASE Place 1 spray into both nostrils daily as needed for allergies or rhinitis.   ibuprofen 600 MG tablet Commonly known as: ADVIL Take 1 tablet (600 mg total) by mouth every 6 (six) hours.   iron polysaccharides 150 MG capsule Commonly known as: NIFEREX Take 1 capsule (150 mg total) by mouth daily.   magnesium oxide 400 (241.3 Mg) MG tablet Commonly known as: MAG-OX Take 1 tablet (400 mg total) by mouth daily.   PRENATAL VITAMIN PO Take by mouth daily.       Allergies: No Known Allergies  Past Medical History:  Diagnosis Date  . Allergy   . Asthma    childhood  . Complication of anesthesia    pt states after having her wisdom teeth out it  took a long time for her to wake up from anesthesia   . Frequent headaches   . Genital warts   . History of chicken pox     Past Surgical History:  Procedure Laterality Date  . WISDOM TOOTH EXTRACTION      Family History  Problem Relation Age of Onset  . Hypothyroidism Mother   . Other Mother        pre-diabetic  . Diabetes Mother   . Alcohol abuse Father   . Other Father        substance abuse  . Arthritis Maternal Grandmother   . Breast cancer Maternal Grandmother 64  . Diabetes Maternal Grandmother   . Dementia Maternal Grandmother 108  . Cancer Maternal Grandmother   . Thyroid disease Sister   . Gout Brother   . Healthy  Maternal Grandfather   . ALS Paternal Grandmother        passed in car accident  . Dementia Paternal Grandfather 38  . Hypothyroidism Maternal Aunt   . Hyperthyroidism Maternal Uncle   . Other Sister        ?fibroids; had ablation    Social History:  reports that she quit smoking about 2 years ago. Her smoking use included cigarettes. She has a 15.00 pack-year smoking history. She has never used smokeless tobacco. She reports current alcohol use. She reports previous drug use. Drug: Marijuana.   Review of Systems    No history of thyroid disease.    TSH done in June 2018 was 1.8 from gynecologist   Physical Exam    BP 118/76 (BP Location: Left Arm, Patient Position: Sitting, Cuff Size: Large)   Pulse 60   Wt 151 lb 12.8 oz (68.9 kg)   SpO2 98%   BMI 27.76 kg/m     ASSESSMENT:  HYPERPROLACTINEMIA, apparently idiopathic and has been asymptomatic Not associated with a pituitary adenoma as of 2018  Previously had now underlying cause or medication use and no evidence of macro prolactinemia Has not had any menstrual irregularities and no galactorrhea  Previously her menstrual cycles had come back to normal after her pregnancy last year Currently she is starting on birth control pill for contraception which should provide her with estrogen supplement and should not exacerbate her hyperprolactinemia  Unlikely that her decreased libido is related to mild hyperprolactinemia, previously estradiol level was not low   PLAN:  She will discuss her symptoms with her gynecologist She will follow-up in about a year unless she has any symptoms   Elayne Snare 04/13/20

## 2020-04-17 LAB — PROLACTIN: Prolactin: 35.8 ng/mL — ABNORMAL HIGH (ref 4.8–23.3)

## 2020-04-17 LAB — ESTRADIOL, FREE
Estradiol, Serum, MS: 2.8 pg/mL
Free Estradiol, Percent: 0.7 %
Free Estradiol, Serum: <0.02 pg/mL — ABNORMAL LOW

## 2020-05-12 ENCOUNTER — Telehealth (INDEPENDENT_AMBULATORY_CARE_PROVIDER_SITE_OTHER): Payer: BC Managed Care – PPO | Admitting: Family Medicine

## 2020-05-12 DIAGNOSIS — R0981 Nasal congestion: Secondary | ICD-10-CM | POA: Diagnosis not present

## 2020-05-12 NOTE — Progress Notes (Signed)
Virtual Visit via Video Note  I connected with Linda Barnett  on 05/12/20 at 10:20 AM EDT by a video enabled telemedicine application and verified that I am speaking with the correct person using two identifiers.  Location patient: home, [place] Location provider:work or home office Persons participating in the virtual visit: patient, provider  I discussed the limitations of evaluation and management by telemedicine and the availability of in person appointments. The patient expressed understanding and agreed to proceed.   HPI:  Acute visit for sinus issues: -symptoms started 5 days ago -symptoms include sore throat, sinus congestion, cough -last week was exposed to a coworker who had COVID, they did wear masks, but she was around her quite a bit -feeling better today -had a negative covid test the day she got sick -denies fevers, chills, malaise, SOB, facial pain, NVD -she is fully vaccinated for COVID19 with pfizer   ROS: See pertinent positives and negatives per HPI.  Past Medical History:  Diagnosis Date  . Allergy   . Asthma    childhood  . Complication of anesthesia    pt states after having her wisdom teeth out it took a long time for her to wake up from anesthesia   . Frequent headaches   . Genital warts   . History of chicken pox     Past Surgical History:  Procedure Laterality Date  . WISDOM TOOTH EXTRACTION      Family History  Problem Relation Age of Onset  . Hypothyroidism Mother   . Other Mother        pre-diabetic  . Diabetes Mother   . Alcohol abuse Father   . Other Father        substance abuse  . Arthritis Maternal Grandmother   . Breast cancer Maternal Grandmother 59  . Diabetes Maternal Grandmother   . Dementia Maternal Grandmother 67  . Cancer Maternal Grandmother   . Thyroid disease Sister   . Gout Brother   . Healthy Maternal Grandfather   . ALS Paternal Grandmother        passed in car accident  . Dementia Paternal Grandfather 69  .  Hypothyroidism Maternal Aunt   . Hyperthyroidism Maternal Uncle   . Other Sister        ?fibroids; had ablation    SOCIAL HX: see hpi   Current Outpatient Medications:  .  acetaminophen (TYLENOL) 500 MG tablet, Take 2 tablets (1,000 mg total) by mouth every 6 (six) hours as needed (for pain scale < 4)., Disp: 30 tablet, Rfl: 0 .  amoxicillin (AMOXIL) 500 MG capsule, Take 1 capsule (500 mg total) by mouth 2 (two) times daily., Disp: 20 capsule, Rfl: 0 .  coconut oil OIL, Apply 1 application topically as needed., Disp:  , Rfl: 0 .  fluticasone (FLONASE) 50 MCG/ACT nasal spray, Place 1 spray into both nostrils daily as needed for allergies or rhinitis., Disp: , Rfl:  .  iron polysaccharides (NIFEREX) 150 MG capsule, Take 1 capsule (150 mg total) by mouth daily., Disp:  , Rfl:  .  magnesium oxide (MAG-OX) 400 (241.3 Mg) MG tablet, Take 1 tablet (400 mg total) by mouth daily., Disp:  , Rfl:  .  SPRINTEC 28 0.25-35 MG-MCG tablet, Take 1 tablet by mouth daily., Disp: , Rfl:   EXAM:  VITALS per patient if applicable:  GENERAL: alert, oriented, appears well and in no acute distress  HEENT: atraumatic, conjunttiva clear, no obvious abnormalities on inspection of external nose and ears  NECK:  normal movements of the head and neck  LUNGS: on inspection no signs of respiratory distress, breathing rate appears normal, no obvious gross SOB, gasping or wheezing  CV: no obvious cyanosis  MS: moves all visible extremities without noticeable abnormality  PSYCH/NEURO: pleasant and cooperative, no obvious depression or anxiety, speech and thought processing grossly intact  ASSESSMENT AND PLAN:  Discussed the following assessment and plan:  Sinus congestion  -we discussed possible serious and likely etiologies, options for evaluation and workup, limitations of telemedicine visit vs in person visit, treatment, treatment risks and precautions. Pt prefers to treat via telemedicine empirically rather  then risking or undertaking an in person visit at this moment. Discussed cough VURI. Possibility of covid given exposure and increased number of breakthrough cases recently, early testing... but seems less likely. Advised retesting for covid today if possible, symptomatic care since feeling better with nasal saline and short course of nasal decongestant. Offered rx for cough, but she feels is doing better. Offered work note - declined. She has been Audiological scientist. Patient agrees to seek prompt in person care if worsening, new symptoms arise, or if is not improving with treatment.   I discussed the assessment and treatment plan with the patient. The patient was provided an opportunity to ask questions and all were answered. The patient agreed with the plan and demonstrated an understanding of the instructions.   The patient was advised to call back or seek an in-person evaluation if the symptoms worsen or if the condition fails to improve as anticipated.   [name], DO

## 2020-05-23 ENCOUNTER — Ambulatory Visit (HOSPITAL_COMMUNITY)
Admission: EM | Admit: 2020-05-23 | Discharge: 2020-05-23 | Disposition: A | Discharge status: Home / Self Care | Payer: BC Managed Care – PPO

## 2020-05-23 ENCOUNTER — Encounter (HOSPITAL_COMMUNITY): Payer: Self-pay | Admitting: *Deleted

## 2020-05-23 ENCOUNTER — Other Ambulatory Visit: Payer: Self-pay

## 2020-05-23 DIAGNOSIS — R21 Rash and other nonspecific skin eruption: Secondary | ICD-10-CM | POA: Diagnosis not present

## 2020-05-23 DIAGNOSIS — M549 Dorsalgia, unspecified: Secondary | ICD-10-CM

## 2020-05-23 DIAGNOSIS — M79651 Pain in right thigh: Secondary | ICD-10-CM

## 2020-05-23 DIAGNOSIS — M79621 Pain in right upper arm: Secondary | ICD-10-CM

## 2020-05-23 DIAGNOSIS — T63461A Toxic effect of venom of wasps, accidental (unintentional), initial encounter: Secondary | ICD-10-CM

## 2020-05-23 MED ORDER — METHYLPREDNISOLONE ACETATE 80 MG/ML IJ SUSP
INTRAMUSCULAR | Status: AC
  Filled 2020-05-23: qty 1

## 2020-05-23 MED ORDER — HYDROCODONE-ACETAMINOPHEN 5-325 MG PO TABS
1.0000 | ORAL_TABLET | Freq: Once | ORAL | Status: AC
  Administered 2020-05-23: 1 via ORAL

## 2020-05-23 MED ORDER — HYDROCODONE-ACETAMINOPHEN 5-325 MG PO TABS
ORAL_TABLET | ORAL | Status: AC
  Filled 2020-05-23: qty 1

## 2020-05-23 MED ORDER — NAPROXEN 500 MG PO TABS: 500.0000 mg | ORAL_TABLET | Freq: Two times a day (BID) | ORAL | 0 refills | Status: DC

## 2020-05-23 MED ORDER — METHYLPREDNISOLONE ACETATE 80 MG/ML IJ SUSP
80.0000 mg | Freq: Once | INTRAMUSCULAR | Status: AC
  Administered 2020-05-23: 80 mg via INTRAMUSCULAR

## 2020-05-23 MED ORDER — HYDROXYZINE HCL 25 MG PO TABS: 12.5000 mg | ORAL_TABLET | Freq: Three times a day (TID) | ORAL | 0 refills | Status: DC | PRN

## 2020-05-23 MED ORDER — METHYLPREDNISOLONE SODIUM SUCC 125 MG IJ SOLR
INTRAMUSCULAR | Status: AC
  Filled 2020-05-23: qty 2

## 2020-05-23 NOTE — ED Triage Notes (Signed)
Pt reports getting approx 14 bee stings this AM @ 1030.  C/O continued significant pain. Took OTC allergy med.  Denies any sensation of throat swelling, but states "it feels like I can't quite get a deep breath".  Pt talking in complete sentences.

## 2020-05-23 NOTE — ED Provider Notes (Signed)
[facility]   MRN: [id number] DOB: 02/07/1976  Subjective:   Linda Barnett is a 44 y.o. female presenting for suffering 14 accidental yellowjacket stings to her back, arms and thighs.  She came straight here.  Did take a naproxen prior to coming.  Denies shortness of breath, wheezing, oral swelling.  She has severe pain from all of the things.  No current facility-administered medications for this encounter.  Current Outpatient Medications:  .  fluticasone (FLONASE) 50 MCG/ACT nasal spray, Place 1 spray into both nostrils daily as needed for allergies or rhinitis., Disp: , Rfl:  .  SPRINTEC 28 0.25-35 MG-MCG tablet, Take 1 tablet by mouth daily., Disp: , Rfl:  .  UNKNOWN TO PATIENT, OTC allergy med, Disp: , Rfl:  .  acetaminophen (TYLENOL) 500 MG tablet, Take 2 tablets (1,000 mg total) by mouth every 6 (six) hours as needed (for pain scale < 4)., Disp: 30 tablet, Rfl: 0 .  amoxicillin (AMOXIL) 500 MG capsule, Take 1 capsule (500 mg total) by mouth 2 (two) times daily., Disp: 20 capsule, Rfl: 0 .  coconut oil OIL, Apply 1 application topically as needed., Disp:  , Rfl: 0 .  iron polysaccharides (NIFEREX) 150 MG capsule, Take 1 capsule (150 mg total) by mouth daily., Disp:  , Rfl:  .  magnesium oxide (MAG-OX) 400 (241.3 Mg) MG tablet, Take 1 tablet (400 mg total) by mouth daily., Disp:  , Rfl:    No Known Allergies  Past Medical History:  Diagnosis Date  . Allergy   . Asthma    childhood  . Complication of anesthesia    pt states after having her wisdom teeth out it took a long time for her to wake up from anesthesia   . Frequent headaches   . Genital warts   . History of chicken pox      Past Surgical History:  Procedure Laterality Date  . WISDOM TOOTH EXTRACTION      Family History  Problem Relation Age of Onset  . Hypothyroidism Mother   . Other Mother        pre-diabetic  . Diabetes Mother   . Alcohol abuse Father   . Other Father        substance abuse    . Arthritis Maternal Grandmother   . Breast cancer Maternal Grandmother 37  . Diabetes Maternal Grandmother   . Dementia Maternal Grandmother 33  . Cancer Maternal Grandmother   . Thyroid disease Sister   . Gout Brother   . Healthy Maternal Grandfather   . ALS Paternal Grandmother        passed in car accident  . Dementia Paternal Grandfather 28  . Hypothyroidism Maternal Aunt   . Hyperthyroidism Maternal Uncle   . Other Sister        ?fibroids; had ablation    Social History   Tobacco Use  . Smoking status: Former Smoker    Packs/day: 1.00    Years: 15.00    Pack years: 15.00    Types: Cigarettes    Quit date: 02/11/2018    Years since quitting: 2.2  . Smokeless tobacco: Never Used  . Tobacco comment: on again off again in 10 years  Vaping Use  . Vaping Use: Never used  Substance Use Topics  . Alcohol use: Yes    Comment: occasional wine  . Drug use: Yes    Types: Marijuana    Comment: regular use    ROS   Objective:  Vitals: BP (!) 122/106   Pulse 91   Temp 98.1 F (36.7 C) (Oral)   Resp 16   LMP 05/11/2020 (Approximate)   SpO2 100%   Breastfeeding No   Physical Exam Constitutional:      General: She is not in acute distress.    Appearance: Normal appearance. She is well-developed. She is not ill-appearing, toxic-appearing or diaphoretic.  HENT:     Head: Normocephalic and atraumatic.     Nose: Nose normal.     Mouth/Throat:     Mouth: Mucous membranes are moist.  Eyes:     Extraocular Movements: Extraocular movements intact.     Pupils: Pupils are equal, round, and reactive to light.  Cardiovascular:     Rate and Rhythm: Normal rate and regular rhythm.     Pulses: Normal pulses.     Heart sounds: Normal heart sounds. No murmur heard.  No friction rub. No gallop.   Pulmonary:     Effort: Pulmonary effort is normal. No respiratory distress.     Breath sounds: Normal breath sounds. No stridor. No wheezing, rhonchi or rales.  Skin:     General: Skin is warm and dry.     Findings: Rash (Multiple sting wounds scattered over upper back, arms and thighs bilaterally) present.  Neurological:     Mental Status: She is alert and oriented to person, place, and time.  Psychiatric:        Mood and Affect: Mood normal.        Behavior: Behavior normal.        Thought Content: Thought content normal.        Judgment: Judgment normal.      Assessment and Plan :   PDMP not reviewed this encounter.  1. Rash and nonspecific skin eruption   2. Yellow jacket sting, accidental or unintentional, initial encounter   3. Upper back pain   4. Pain in both thighs   5. Pain in both upper arms     I removed once tender over the posterior right neck.  She is stable, airway is patent, no oral or facial swelling.  Patient has clear lung sounds.  Hydrocodone in clinic for her pain.  IM Depo-Medrol for developing allergic reaction.  Use hydroxyzine and naproxen at home. Counseled patient on potential for adverse effects with medications prescribed/recommended today, ER and return-to-clinic precautions discussed, patient verbalized understanding.    Jaynee Eagles, PA-C 05/23/20 1332

## 2020-08-18 ENCOUNTER — Other Ambulatory Visit: Payer: Self-pay

## 2020-08-18 ENCOUNTER — Telehealth (INDEPENDENT_AMBULATORY_CARE_PROVIDER_SITE_OTHER): Payer: BC Managed Care – PPO | Admitting: Family Medicine

## 2020-08-18 DIAGNOSIS — J029 Acute pharyngitis, unspecified: Secondary | ICD-10-CM | POA: Diagnosis not present

## 2020-08-18 MED ORDER — AMOXICILLIN 500 MG PO CAPS: 500.0000 mg | ORAL_CAPSULE | Freq: Two times a day (BID) | ORAL | 0 refills | Status: DC

## 2020-08-18 NOTE — Progress Notes (Signed)
Virtual Visit via Video Note  I connected with Linda Barnett  on 08/18/20 at  4:00 PM EST by a video enabled telemedicine application and verified that I am speaking with the correct person using two identifiers.  Location patient: home, [place] Location provider:work or home office Persons participating in the virtual visit: patient, provider  I discussed the limitations of evaluation and management by telemedicine and the availability of in person appointments. The patient expressed understanding and agreed to proceed.   HPI:  Acute telemedicine visit for Sore Throat: -Onset: yesterday -was exposed to sick child in her home a few days ago who tested positive for strep -Symptoms include: really sore throat, mild body aches, mild diarrhea -Denies: fever, CP, trouble breathing or swallowing, sinus congestion, much cough -Has tried: nyquil -Pertinent past medical history: none -Pertinent medication allergies: nkda -COVID-19 vaccine status: fully vaccinated and had booster -not breastfeeding  ROS: See pertinent positives and negatives per HPI.  Past Medical History:  Diagnosis Date  . Allergy   . Asthma    childhood  . Complication of anesthesia    pt states after having her wisdom teeth out it took a long time for her to wake up from anesthesia   . Frequent headaches   . Genital warts   . History of chicken pox     Past Surgical History:  Procedure Laterality Date  . WISDOM TOOTH EXTRACTION       Current Outpatient Medications:  .  acetaminophen (TYLENOL) 500 MG tablet, Take 2 tablets (1,000 mg total) by mouth every 6 (six) hours as needed (for pain scale < 4)., Disp: 30 tablet, Rfl: 0 .  amoxicillin (AMOXIL) 500 MG capsule, Take 1 capsule (500 mg total) by mouth 2 (two) times daily., Disp: 20 capsule, Rfl: 0 .  coconut oil OIL, Apply 1 application topically as needed., Disp:  , Rfl: 0 .  fluticasone (FLONASE) 50 MCG/ACT nasal spray, Place 1 spray into both nostrils daily as needed  for allergies or rhinitis., Disp: , Rfl:  .  hydrOXYzine (ATARAX/VISTARIL) 25 MG tablet, Take 0.5-1 tablets (12.5-25 mg total) by mouth every 8 (eight) hours as needed for itching., Disp: 30 tablet, Rfl: 0 .  iron polysaccharides (NIFEREX) 150 MG capsule, Take 1 capsule (150 mg total) by mouth daily., Disp:  , Rfl:  .  magnesium oxide (MAG-OX) 400 (241.3 Mg) MG tablet, Take 1 tablet (400 mg total) by mouth daily., Disp:  , Rfl:  .  naproxen (NAPROSYN) 500 MG tablet, Take 1 tablet (500 mg total) by mouth 2 (two) times daily with a meal., Disp: 30 tablet, Rfl: 0 .  SPRINTEC 28 0.25-35 MG-MCG tablet, Take 1 tablet by mouth daily., Disp: , Rfl:  .  UNKNOWN TO PATIENT, OTC allergy med, Disp: , Rfl:   EXAM:  VITALS per patient if applicable:  GENERAL: alert, oriented, appears well and in no acute distress  HEENT: atraumatic, conjunttiva clear, no obvious abnormalities on inspection of external nose and ears, mucus membranes moist, mild post oropharyngeal erythema and mild tonsillar edema on video visit exam  NECK: normal movements of the head and neck  LUNGS: on inspection no signs of respiratory distress, breathing rate appears normal, no obvious gross SOB, gasping or wheezing  CV: no obvious cyanosis  MS: moves all visible extremities without noticeable abnormality  PSYCH/NEURO: pleasant and cooperative, no obvious depression or anxiety, speech and thought processing grossly intact  ASSESSMENT AND PLAN:  Discussed the following assessment and plan:  Sore throat  -  we discussed possible serious and likely etiologies, options for evaluation and workup, limitations of telemedicine visit vs in person visit, treatment, treatment risks and precautions. Pt prefers to treat via telemedicine empirically rather than in person at this moment.  Query strep pharyngitis, viral infection versus other.  She prefers to do empiric treatment with amoxicillin 500 mg twice daily for 10 days for possible strep.   She understands risks of doing this, rather than testing for more definitive evaluation. Work/School slipped offered:  declined Advised to seek prompt in person care if worsening, new symptoms arise, or if is not improving with treatment. Discussed options for inperson care if PCP office not available. Did let this patient know that I only do telemedicine on Tuesdays and Thursdays for [facility]. Advised to schedule follow up visit with PCP or [facility] if any further questions or concerns to avoid delays in care.   I discussed the assessment and treatment plan with the patient. The patient was provided an opportunity to ask questions and all were answered. The patient agreed with the plan and demonstrated an understanding of the instructions.     [name], DO

## 2020-08-18 NOTE — Patient Instructions (Signed)
-  I sent the medication(s) we discussed to your pharmacy: Meds ordered this encounter  Medications  . amoxicillin (AMOXIL) 500 MG capsule    Sig: Take 1 capsule (500 mg total) by mouth 2 (two) times daily.    Dispense:  20 capsule    Refill:  0   Would also consider testing for COVID-19.  Please stay home while sick.  I hope you are feeling better soon!  Seek in person care promptly if your symptoms worsen, new concerns arise or you are not improving with treatment.  It was nice to meet you today. I help [facility] out with telemedicine visits on Tuesdays and Thursdays and am available for visits on those days. If you have any concerns or questions following this visit please schedule a follow up visit with your Primary Care doctor or seek care at a local urgent care clinic to avoid delays in care.

## 2021-01-05 ENCOUNTER — Encounter: Payer: Self-pay | Admitting: Family Medicine

## 2021-01-05 LAB — HM MAMMOGRAPHY

## 2021-01-11 ENCOUNTER — Encounter: Payer: Self-pay | Admitting: Family Medicine

## 2021-03-02 ENCOUNTER — Telehealth (INDEPENDENT_AMBULATORY_CARE_PROVIDER_SITE_OTHER): Payer: BC Managed Care – PPO | Admitting: Family Medicine

## 2021-03-02 DIAGNOSIS — R519 Headache, unspecified: Secondary | ICD-10-CM

## 2021-03-02 DIAGNOSIS — R0981 Nasal congestion: Secondary | ICD-10-CM | POA: Diagnosis not present

## 2021-03-02 MED ORDER — AMOXICILLIN-POT CLAVULANATE 875-125 MG PO TABS: 1.0000 | ORAL_TABLET | Freq: Two times a day (BID) | ORAL | 0 refills | Status: DC

## 2021-03-02 NOTE — Progress Notes (Signed)
Virtual Visit via Video Note  I connected with Linda Barnett  on 03/02/21 at 11:40 AM EDT by a video enabled telemedicine application and verified that I am speaking with the correct person using two identifiers.  Location patient: work, [place] Location provider:work or home office Persons participating in the virtual visit: patient, provider  I discussed the limitations of evaluation and management by telemedicine and the availability of in person appointments. The patient expressed understanding and agreed to proceed.   HPI:  Acute telemedicine visit for Sinus issues: -Onset: with covid a few weeks ago -Symptoms include: thick persistent sinus congestion since had covid and then developed sinus discomfort and has thick green sinus drainage -Denies: fevers, CP, SOB, NVD, inability to eat/drink/get out of bed -Pertinent past medical history:  Asthma, allergy -Pertinent medication allergies:No Known Allergies  -denies any chance of pregnancy currently -COVID-19 vaccine status: vaccinated and boosted  ROS: See pertinent positives and negatives per HPI.  Past Medical History:  Diagnosis Date  . Allergy   . Asthma    childhood  . Complication of anesthesia    pt states after having her wisdom teeth out it took a long time for her to wake up from anesthesia   . Frequent headaches   . Genital warts   . History of chicken pox     Past Surgical History:  Procedure Laterality Date  . WISDOM TOOTH EXTRACTION       Current Outpatient Medications:  .  amoxicillin-clavulanate (AUGMENTIN) 875-125 MG tablet, Take 1 tablet by mouth 2 (two) times daily., Disp: 20 tablet, Rfl: 0 .  acetaminophen (TYLENOL) 500 MG tablet, Take 2 tablets (1,000 mg total) by mouth every 6 (six) hours as needed (for pain scale < 4)., Disp: 30 tablet, Rfl: 0 .  amoxicillin (AMOXIL) 500 MG capsule, Take 1 capsule (500 mg total) by mouth 2 (two) times daily., Disp: 20 capsule, Rfl: 0 .  coconut oil OIL, Apply 1 application  topically as needed., Disp:  , Rfl: 0 .  fluticasone (FLONASE) 50 MCG/ACT nasal spray, Place 1 spray into both nostrils daily as needed for allergies or rhinitis., Disp: , Rfl:  .  hydrOXYzine (ATARAX/VISTARIL) 25 MG tablet, Take 0.5-1 tablets (12.5-25 mg total) by mouth every 8 (eight) hours as needed for itching., Disp: 30 tablet, Rfl: 0 .  iron polysaccharides (NIFEREX) 150 MG capsule, Take 1 capsule (150 mg total) by mouth daily., Disp:  , Rfl:  .  magnesium oxide (MAG-OX) 400 (241.3 Mg) MG tablet, Take 1 tablet (400 mg total) by mouth daily., Disp:  , Rfl:  .  naproxen (NAPROSYN) 500 MG tablet, Take 1 tablet (500 mg total) by mouth 2 (two) times daily with a meal., Disp: 30 tablet, Rfl: 0 .  SPRINTEC 28 0.25-35 MG-MCG tablet, Take 1 tablet by mouth daily., Disp: , Rfl:  .  UNKNOWN TO PATIENT, OTC allergy med, Disp: , Rfl:   EXAM:  VITALS per patient if applicable:  GENERAL: alert, oriented, appears well and in no acute distress  HEENT: atraumatic, conjunttiva clear, no obvious abnormalities on inspection of external nose and ears  NECK: normal movements of the head and neck  LUNGS: on inspection no signs of respiratory distress, breathing rate appears normal, no obvious gross SOB, gasping or wheezing  CV: no obvious cyanosis  MS: moves all visible extremities without noticeable abnormality  PSYCH/NEURO: pleasant and cooperative, no obvious depression or anxiety, speech and thought processing grossly intact  ASSESSMENT AND PLAN:  Discussed the following  assessment and plan:  Sinus congestion  Facial discomfort  -we discussed possible serious and likely etiologies, options for evaluation and workup, limitations of telemedicine visit vs in person visit, treatment, treatment risks and precautions. Pt prefers to treat via telemedicine empirically rather than in person at this moment. Query sinusitis, new acute viral illness vs other. She opted for repeat covid testing, nasal saline  and Augmentin (rx sent) if worsening or persistent symptoms.  Work/School slipped offered:  declined Advised to seek prompt in person care if worsening, new symptoms arise, or if is not improving with treatment. .   I discussed the assessment and treatment plan with the patient. The patient was provided an opportunity to ask questions and all were answered. The patient agreed with the plan and demonstrated an understanding of the instructions.     [name], DO

## 2021-03-02 NOTE — Patient Instructions (Signed)
-  I sent the medication(s) we discussed to your pharmacy: Meds ordered this encounter  Medications  . amoxicillin-clavulanate (AUGMENTIN) 875-125 MG tablet    Sig: Take 1 tablet by mouth 2 (two) times daily.    Dispense:  20 tablet    Refill:  0     I hope you are feeling better soon!  Seek in person care promptly if your symptoms worsen, new concerns arise or you are not improving with treatment.  It was nice to meet you today. I help [facility] out with telemedicine visits on Tuesdays and Thursdays and am available for visits on those days. If you have any concerns or questions following this visit please schedule a follow up visit with your Primary Care doctor or seek care at a local urgent care clinic to avoid delays in care.

## 2021-04-04 LAB — COLOGUARD: COLOGUARD: NEGATIVE

## 2021-04-04 LAB — EXTERNAL GENERIC LAB PROCEDURE: COLOGUARD: NEGATIVE

## 2021-12-09 ENCOUNTER — Telehealth (INDEPENDENT_AMBULATORY_CARE_PROVIDER_SITE_OTHER): Payer: BC Managed Care – PPO | Admitting: Family Medicine

## 2021-12-09 DIAGNOSIS — R635 Abnormal weight gain: Secondary | ICD-10-CM

## 2021-12-09 DIAGNOSIS — M79672 Pain in left foot: Secondary | ICD-10-CM

## 2021-12-09 NOTE — Patient Instructions (Signed)
-  healthy way of eating (the Mediterranean diet is one of the best in terms of health benefits) ? ?-regular exercise - can start small if the schedule is bust (5-10 minutes per day is better than none ) ? ?-consider gait analysis when you get new athletic shoes ? ?-can use topical capsaicin or methol for pain or aleve for occasional use ? ?-if worsening or not improving over the next few week please schedule visit with one of the orthopedic urgent cares or call for a sports medicine clinic referral ? ? ?I hope you are feeling better soon! ? ?Seek in person care promptly if your symptoms worsen, new concerns arise or you are not improving with treatment. ? ?It was nice to meet you today. I help [facility] out with telemedicine visits on Tuesdays and Thursdays and am happy to help if you need a virtual follow up visit on those days. Otherwise, if you have any concerns or questions following this visit please schedule a follow up visit with your Primary Care office or seek care at a local urgent care clinic to avoid delays in care ? ? ? ?

## 2021-12-09 NOTE — Progress Notes (Signed)
Virtual Visit via Video Note ? ?I connected with  ? on 12/09/21 at  4:40 PM EDT by a video enabled telemedicine application and verified that I am speaking with the correct person using two identifiers. ? Location patient: [place] ?Location provider:work or home office ?Persons participating in the virtual visit: patient, provider ? ?I discussed the limitations and requested verbal permission for telemedicine visit. The patient expressed understanding and agreed to proceed. ? ? ?HPI: ? ?Acute telemedicine visit for : ?-Onset: about 1 month ago ?-Symptoms include: some pain in the L 2nd MTP joint region when waking only  ?-Denies:swelling, redness, known injury,  new shoes or change in activies (except reports has been less active due to schedule this last year) ?-reports has gained some weight and has not been exercising ?-Pertinent past medical history: see below ?-Pertinent medication allergies:No Known Allergies ?-COVID-19 vaccine status:  ?Immunization History  ?Administered Date(s) Administered  ? Influenza,inj,Quad PF,6+ Mos 06/09/2017  ? Influenza-Unspecified 07/11/2018  ? Moderna Sars-Covid-2 Vaccination 01/04/2020  ? PFIZER(Purple Top)SARS-COV-2 Vaccination 12/09/2019  ? Td 09/26/2016  ? ? ? ?ROS: See pertinent positives and negatives per HPI. ? ?Past Medical History:  ?Diagnosis Date  ? Allergy   ? Asthma   ? childhood  ? Complication of anesthesia   ? pt states after having her wisdom teeth out it took a long time for her to wake up from anesthesia   ? Frequent headaches   ? Genital warts   ? History of chicken pox   ? ? ?Past Surgical History:  ?Procedure Laterality Date  ? WISDOM TOOTH EXTRACTION    ? ? ? ?Current Outpatient Medications:  ?  acetaminophen (TYLENOL) 500 MG tablet, Take 2 tablets (1,000 mg total) by mouth every 6 (six) hours as needed (for pain scale < 4)., Disp: 30 tablet, Rfl: 0 ?  amoxicillin (AMOXIL) 500 MG capsule, Take 1 capsule (500 mg total) by mouth 2 (two) times daily., Disp: 20  capsule, Rfl: 0 ?  amoxicillin-clavulanate (AUGMENTIN) 875-125 MG tablet, Take 1 tablet by mouth 2 (two) times daily., Disp: 20 tablet, Rfl: 0 ?  coconut oil OIL, Apply 1 application topically as needed., Disp:  , Rfl: 0 ?  fluticasone (FLONASE) 50 MCG/ACT nasal spray, Place 1 spray into both nostrils daily as needed for allergies or rhinitis., Disp: , Rfl:  ?  hydrOXYzine (ATARAX/VISTARIL) 25 MG tablet, Take 0.5-1 tablets (12.5-25 mg total) by mouth every 8 (eight) hours as needed for itching., Disp: 30 tablet, Rfl: 0 ?  iron polysaccharides (NIFEREX) 150 MG capsule, Take 1 capsule (150 mg total) by mouth daily., Disp:  , Rfl:  ?  magnesium oxide (MAG-OX) 400 (241.3 Mg) MG tablet, Take 1 tablet (400 mg total) by mouth daily., Disp:  , Rfl:  ?  naproxen (NAPROSYN) 500 MG tablet, Take 1 tablet (500 mg total) by mouth 2 (two) times daily with a meal., Disp: 30 tablet, Rfl: 0 ?  SPRINTEC 28 0.25-35 MG-MCG tablet, Take 1 tablet by mouth daily., Disp: , Rfl:  ?  UNKNOWN TO PATIENT, OTC allergy med, Disp: , Rfl:  ? ?EXAM: ? ?VITALS per patient if applicable: ? ?GENERAL: alert, oriented, appears well and in no acute distress ? ?HEENT: atraumatic, conjunttiva clear, no obvious abnormalities on inspection of external nose and ears ? ?NECK: normal movements of the head and neck ? ?LUNGS: on inspection no signs of respiratory distress, breathing rate appears normal, no obvious gross SOB, gasping or wheezing ? ?CV: no obvious cyanosis ? ?  MS: moves all visible extremities without noticeable abnormality, she shoes me her foot - not redness or swelling appreciated over video visit, she points to L 2nd MCT as area of discomfort, reports discomfort with walking and with extension of this digit ? ?PSYCH/NEURO: pleasant and cooperative, no obvious depression or anxiety, speech and thought processing grossly intact ? ?ASSESSMENT AND PLAN: ? ?Discussed the following assessment and plan: ? ?Left foot pain ? ?Weight gain ? ?-we discussed  possible serious and likely etiologies, options for evaluation and workup, limitations of telemedicine visit vs in person visit, treatment, treatment risks and precautions. Pt is agreeable to treatment via telemedicine at this moment. Query OA or strain from recent weight gain and inactivity. Advised of options for imaging/eval with ortho uccs or sports medicine. She wants to try change in footwear, MT pads, analgesic if needed first, along with improved activity level and weight loss. Advised of options for gait analysis for athletic shoes. Advised healthy diet, 10lb wt reduction, regular exercise and endo follow up as well. Also advised the [facility] obesity medicine clinic could be helpful too.  ?Advised to seek prompt  in person evaluation with sports med, PCP or ortho if worsening or not improving with the other measures over the next few weeks. ?  ?I discussed the assessment and treatment plan with the patient. The patient was provided an opportunity to ask questions and all were answered. Spent over 20 minutes in exam and counseling and patient instructions for this visit. The patient agreed with the plan and demonstrated an understanding of the instructions. ?  ? ? ?[name], DO  ? ?

## 2022-02-03 ENCOUNTER — Telehealth (INDEPENDENT_AMBULATORY_CARE_PROVIDER_SITE_OTHER): Payer: BC Managed Care – PPO | Admitting: Family Medicine

## 2022-02-03 ENCOUNTER — Telehealth: Payer: Self-pay | Admitting: *Deleted

## 2022-02-03 VITALS — Ht 62.75 in | Wt 163.0 lb

## 2022-02-03 DIAGNOSIS — E221 Hyperprolactinemia: Secondary | ICD-10-CM

## 2022-02-03 DIAGNOSIS — Z6829 Body mass index (BMI) 29.0-29.9, adult: Secondary | ICD-10-CM | POA: Diagnosis not present

## 2022-02-03 DIAGNOSIS — F439 Reaction to severe stress, unspecified: Secondary | ICD-10-CM | POA: Diagnosis not present

## 2022-02-03 DIAGNOSIS — Z862 Personal history of diseases of the blood and blood-forming organs and certain disorders involving the immune mechanism: Secondary | ICD-10-CM

## 2022-02-03 DIAGNOSIS — R5383 Other fatigue: Secondary | ICD-10-CM

## 2022-02-03 DIAGNOSIS — E669 Obesity, unspecified: Secondary | ICD-10-CM

## 2022-02-03 NOTE — Patient Instructions (Signed)
Orders Placed This Encounter  ?Procedures  ? Lipid panel  ? Hemoglobin A1c  ? Basic Metabolic Panel  ? CBC with Differential/Platelets  ? TSH  ? Prolactin  ?  ? ?Call your endocrinologist today to schedule follow up.  ? ?Please schedule a lab visit for the above labs. Please schedule a morning appointment so that you can come fasting, but drink plenty of water before your visit. ? ?Schedule a follow up with the new provider at [facility] in 1-2 months or a follow up with me if they are not available.  ? ? ?Please start doing 3-5 minutes per day of meditation, guided meditation, deep breathing or another relaxation technique (see link for ideas and more information): ?[email or web address] ? ?Try to increase exercise to 10 minutes per day. Walking or a 10 minute you tube video might be options you could fit in right now.  ? ?Please avoid any food or beverages with added sugar. Can try a handful of nuts or seeds, with a handful of fresh or frozen berries and a small piece of dark chocolate (preferably only sweetened with stevia or monk fruit) for cravings. ? ?Food: ?-at least half your plate veggies ?-small amounts of fresh fruit - berries are best ?-a small portion of fish or protein ?-avoid or consume only a small amount of whole grains such as quinoa, wild rice or dense and heavy whole grain bread (avoid white rice, pasta, white bread, etc.)  ?-Please avoid milk. You can have small amounts of whole organic butter or cheese from time to time ?-eggs are ok ?-drink water - avoid sweetened beverages ?-avoid processed and fast foods ? ? ?

## 2022-02-03 NOTE — Telephone Encounter (Signed)
Per Dr Maudie Mercury, I left a detailed message at the patient's cell number to call the office to schedule a fasting lab appt. ?

## 2022-02-03 NOTE — Progress Notes (Addendum)
Virtual Visit via Video Note  I connected with Linda Barnett  on 02/10/22 at 12:00 PM EDT by a video enabled telemedicine application and verified that I am speaking with the correct person using two identifiers.  Location patient: [place] Location provider:work or home office Persons participating in the virtual visit: patient, provider  I discussed the limitations and requested verbal permission for telemedicine visit. The patient expressed understanding and agreed to proceed.   HPI:  Acute telemedicine visit for concerns regarding her blood sugar: -Onset:about the last month or so - wants to check her diabetes labs -family members were sick with a virus recently -reports has had more stress at work and has more work with toddler at home, admits to some increased anxiety -Symptoms include: feels "blood sugar is off", feels emotional on and off, feels easily irritable at times, feels sluggish at times, reports gained weight during pandemic has been above what she would like for at least the last 6 months (denies significant changes the last few months), occasional frontal headaches (not severe or persistent), feels tired at times -periods are regular and last 5 days - on OCP -Denies:denies increased urination or excessive thirst, vision changes, fevers, heat or cold intolerance, resp issues, CP, palpitations, depressed mood, skin changes -breakfast: egg, bacon, orange juice, cereal with whole milk -snacks: mocha coffee, admits to chocolate addiction -lunch: sometimes skips or has rushed lunch which leads to unhealthy lunch, sometimes salad -dinner: hello fresh for dinner: spinach ravioli with roasted tomatoes and peppers and cream sauce; for tonight hello fresh -drinks: water, coffee, wine, juice -no regular exercise -stress management: sees a therapist once per month -connections: good with husband, closest friends live elsewhere - she has a busy life; does pottery one night per week - but missed the  current session so not doing recently -has a history of idiopathic hyperprolactinemia per review of chart, seeing endocrine, reports she is scheduling follow up with her specialist for this. Has not had labs in some time. Had normal MRI in the past.    ROS: See pertinent positives and negatives per HPI.  Past Medical History:  Diagnosis Date   Allergy    Asthma    childhood   Complication of anesthesia    pt states after having her wisdom teeth out it took a long time for her to wake up from anesthesia    Frequent headaches    Genital warts    History of chicken pox     Past Surgical History:  Procedure Laterality Date   WISDOM TOOTH EXTRACTION       Current Outpatient Medications:    acetaminophen (TYLENOL) 500 MG tablet, Take 2 tablets (1,000 mg total) by mouth every 6 (six) hours as needed (for pain scale < 4)., Disp: 30 tablet, Rfl: 0   amoxicillin (AMOXIL) 500 MG capsule, Take 1 capsule (500 mg total) by mouth 2 (two) times daily., Disp: 20 capsule, Rfl: 0   amoxicillin-clavulanate (AUGMENTIN) 875-125 MG tablet, Take 1 tablet by mouth 2 (two) times daily., Disp: 20 tablet, Rfl: 0   coconut oil OIL, Apply 1 application topically as needed., Disp:  , Rfl: 0   fluticasone (FLONASE) 50 MCG/ACT nasal spray, Place 1 spray into both nostrils daily as needed for allergies or rhinitis., Disp: , Rfl:    hydrOXYzine (ATARAX/VISTARIL) 25 MG tablet, Take 0.5-1 tablets (12.5-25 mg total) by mouth every 8 (eight) hours as needed for itching., Disp: 30 tablet, Rfl: 0   iron polysaccharides (NIFEREX) 150 MG  capsule, Take 1 capsule (150 mg total) by mouth daily., Disp:  , Rfl:    magnesium oxide (MAG-OX) 400 (241.3 Mg) MG tablet, Take 1 tablet (400 mg total) by mouth daily., Disp:  , Rfl:    naproxen (NAPROSYN) 500 MG tablet, Take 1 tablet (500 mg total) by mouth 2 (two) times daily with a meal., Disp: 30 tablet, Rfl: 0   SPRINTEC 28 0.25-35 MG-MCG tablet, Take 1 tablet by mouth daily., Disp: ,  Rfl:    UNKNOWN TO PATIENT, OTC allergy med, Disp: , Rfl:   EXAM:  VITALS per patient if applicable: Body mass index is 29.1 kg/m. Weight is 163   GENERAL: alert, oriented, appears well and in no acute distress  HEENT: atraumatic, conjunttiva clear, no obvious abnormalities on inspection of external nose and ears  NECK: normal movements of the head and neck  LUNGS: on inspection no signs of respiratory distress, breathing rate appears normal, no obvious gross SOB, gasping or wheezing  CV: no obvious cyanosis  MS: moves all visible extremities without noticeable abnormality  PSYCH/NEURO: pleasant and cooperative, no obvious depression or anxiety, speech and thought processing grossly intact  ASSESSMENT AND PLAN:  Discussed the following assessment and plan:  Hyperprolactinemia ([place]) - Plan: Prolactin  History of anemia - Plan: CBC with Differential/Platelets  Obesity, unspecified classification, unspecified obesity type, unspecified whether serious comorbidity present - Plan: Lipid panel, Hemoglobin T1Z, Basic Metabolic Panel, TSH  Stress  Other fatigue - Plan: Basic Metabolic Panel, CBC with Differential/Platelets, TSH  -we discussed possible serious and likely etiologies, options for evaluation and workup, limitations of telemedicine visit vs in person visit, treatment, treatment risks and precautions. Pt is agreeable to treatment via telemedicine at this moment. Orders per above - sent message to assistant to contact pt to help her schedule fasting lab visit. She reports she will call her endocrinologist today to schedule follow up - I went ahead and ordered a prolactin level so she will have that for her visit. Discussed a healthy lifestyle (healthy whole foods diet, regular exercise, stress management, connections) at length for good health, stress and anxiety management and for weight reduction - summary in patient instructions. Advise follow up with new PCP or me in 1-2  months.  Scheduled follow up with PCP offered: sent message to schedulers Advise to seek prompt in person care in the interim if worsening, new symptoms arise, or if is not improving with treatment as expected per our conversation of expected course. Discussed options for follow up care. Did let this patient know that I do telemedicine on Tuesdays and Thursdays for [facility] and those are the days I am logged into the system. Advised to schedule follow up visit with PCP, [facility] virtual visits or [facility] if any further questions or concerns to avoid delays in care.   I discussed the assessment and treatment plan with the patient. Spent over 30 minutes on review of chart, exam with patient and counseling and orders. The patient was provided an opportunity to ask questions and all were answered. The patient agreed with the plan and demonstrated an understanding of the instructions.     [name], DO

## 2022-02-09 ENCOUNTER — Other Ambulatory Visit (INDEPENDENT_AMBULATORY_CARE_PROVIDER_SITE_OTHER): Payer: BC Managed Care – PPO

## 2022-02-09 ENCOUNTER — Telehealth: Payer: Self-pay | Admitting: *Deleted

## 2022-02-09 DIAGNOSIS — D649 Anemia, unspecified: Secondary | ICD-10-CM

## 2022-02-09 DIAGNOSIS — R5383 Other fatigue: Secondary | ICD-10-CM

## 2022-02-09 DIAGNOSIS — E221 Hyperprolactinemia: Secondary | ICD-10-CM

## 2022-02-09 DIAGNOSIS — E669 Obesity, unspecified: Secondary | ICD-10-CM | POA: Diagnosis not present

## 2022-02-09 LAB — LIPID PANEL
Cholesterol: 158 mg/dL (ref 0–200)
HDL: 53.1 mg/dL (ref >39.00)
LDL Cholesterol: 79 mg/dL (ref 0–99)
NonHDL: 104.92
Total CHOL/HDL Ratio: 3
Triglycerides: 132 mg/dL (ref 0.0–149.0)
VLDL: 26.4 mg/dL (ref 0.0–40.0)

## 2022-02-09 LAB — CBC WITH DIFFERENTIAL/PLATELET
Basophils Absolute: 0 10*3/uL (ref 0.0–0.1)
Basophils Relative: 0.7 % (ref 0.0–3.0)
Eosinophils Absolute: 0.1 10*3/uL (ref 0.0–0.7)
Eosinophils Relative: 1.5 % (ref 0.0–5.0)
HCT: 37.6 % (ref 36.0–46.0)
Hemoglobin: 12.5 g/dL (ref 12.0–15.0)
Lymphocytes Relative: 31.2 % (ref 12.0–46.0)
Lymphs Abs: 1.5 10*3/uL (ref 0.7–4.0)
MCHC: 33.3 g/dL (ref 30.0–36.0)
MCV: 88 fl (ref 78.0–100.0)
Monocytes Absolute: 0.7 10*3/uL (ref 0.1–1.0)
Monocytes Relative: 14.3 % — ABNORMAL HIGH (ref 3.0–12.0)
Neutro Abs: 2.5 10*3/uL (ref 1.4–7.7)
Neutrophils Relative %: 52.3 % (ref 43.0–77.0)
Platelets: 239 10*3/uL (ref 150.0–400.0)
RBC: 4.27 Mil/uL (ref 3.87–5.11)
RDW: 13.1 % (ref 11.5–15.5)
WBC: 4.7 10*3/uL (ref 4.0–10.5)

## 2022-02-09 LAB — BASIC METABOLIC PANEL
BUN: 9 mg/dL (ref 6–23)
CO2: 26 mEq/L (ref 19–32)
Calcium: 8.3 mg/dL — ABNORMAL LOW (ref 8.4–10.5)
Chloride: 104 mEq/L (ref 96–112)
Creatinine, Ser: 0.78 mg/dL (ref 0.40–1.20)
GFR: 90.07 mL/min (ref >60.00)
Glucose, Bld: 110 mg/dL — ABNORMAL HIGH (ref 70–99)
Potassium: 3.9 mEq/L (ref 3.5–5.1)
Sodium: 137 mEq/L (ref 135–145)

## 2022-02-09 LAB — TSH: TSH: 2.4 u[IU]/mL (ref 0.35–5.50)

## 2022-02-09 LAB — HEMOGLOBIN A1C: Hgb A1c MFr Bld: 5.9 % (ref 4.6–6.5)

## 2022-02-09 NOTE — Telephone Encounter (Signed)
Doren Custard, lab tech sent a message requesting the lab orders entered by Dr Maudie Mercury be re-entered as future under PCP.  Dr Ethlyn Gallery sent a teams message stating the patient has not seen her since 2020 and to re-enter the orders under Dr Julianne Rice name.  Orders were placed and Doren Custard is aware.   ?

## 2022-02-10 LAB — PROLACTIN: Prolactin: 17.2 ng/mL

## 2023-01-02 NOTE — Telephone Encounter (Signed)
NA

## 2023-04-11 ENCOUNTER — Other Ambulatory Visit (INDEPENDENT_AMBULATORY_CARE_PROVIDER_SITE_OTHER): Payer: BC Managed Care – PPO

## 2023-04-11 ENCOUNTER — Telehealth: Payer: Self-pay

## 2023-04-11 ENCOUNTER — Other Ambulatory Visit: Payer: Self-pay | Admitting: Endocrinology

## 2023-04-11 DIAGNOSIS — E221 Hyperprolactinemia: Secondary | ICD-10-CM | POA: Diagnosis not present

## 2023-04-11 NOTE — Telephone Encounter (Signed)
Please contact patient to schedule appointment.

## 2023-04-11 NOTE — Telephone Encounter (Signed)
LMx1 for patient to call office to schedule appointment.

## 2023-04-11 NOTE — Telephone Encounter (Signed)
Patient is scheduled for labs today (04/11/2023) and for visit with Dr. Lucianne Muss on Friday, 04/14/2023 at 8:30 AM.

## 2023-04-13 LAB — PROLACTIN: Prolactin: 31.3 ng/mL (ref 4.8–33.4)

## 2023-04-14 ENCOUNTER — Ambulatory Visit: Payer: BC Managed Care – PPO | Admitting: Endocrinology

## 2023-04-14 ENCOUNTER — Encounter: Payer: Self-pay | Admitting: Endocrinology

## 2023-04-14 VITALS — BP 120/78 | HR 78 | Ht 62.75 in | Wt 156.0 lb

## 2023-04-14 DIAGNOSIS — E221 Hyperprolactinemia: Secondary | ICD-10-CM | POA: Diagnosis not present

## 2023-04-14 NOTE — Progress Notes (Signed)
Patient ID: Linda Barnett, female   DOB: January 08, 1976, 47 y.o.   MRN: [id number]           Chief complaint: Follow-up of high prolactin   History of Present Illness  Background history: She had a missed menstrual cycle in May 2018 prior to her visit  with her gynecologist  Her prolactin was tested and was increased in 30s Because of her potential desire for pregnancy she was recommended cabergoline in 04/2017 However patient was not wanting to start this and may have taken only 1 tablet She did have fairly regular menstrual cycles on her own subsequently  Previous prolactin levels: June 2018: 37.6 and 32 within a few days apart, the second level was done fasting in the morning  Recent history:  In 3/20 her prolactin level increased with the pregnancy She had a normal delivery in 06/2019 She reports that she did not have enough breastmilk when she was breast-feeding  She has not been seen since 7/21 Currently back on birth control pills although prior to that her menstrual cycles were irregular She may have been having a headache every couple of months or so but nothing regular or severe  She does not notice any milky breast discharge   Her prolactin level is now 31 compared to 17 last year  Previous studies: To evaluate her hyperprolactinemia the macroprolactin was evaluated and was found to be only 16% Previously had an estradiol level done which was drawn the day before her menstrual cycle started and the level was low normal for the luteal phase at 42  MRI of pituitary gland in 04/2017 shows no pituitary abnormality  Prolactin levels:   Lab Results  Component Value Date   PROLACTIN 31.3 04/11/2023   PROLACTIN 17.2 02/09/2022   PROLACTIN 35.8 (H) 04/09/2020   PROLACTIN 95.1 (H) 12/13/2018      Lab Results  Component Value Date   TSH 2.40 02/09/2022     Allergies as of 04/14/2023   No Known Allergies      Medication List        Accurate as of April 14, 2023  8:36 AM. If you have any questions, ask your nurse or doctor.          STOP taking these medications    acetaminophen 500 MG tablet Commonly known as: Tylenol Stopped by: Reather Littler   amoxicillin 500 MG capsule Commonly known as: AMOXIL Stopped by: Reather Littler   amoxicillin-clavulanate 875-125 MG tablet Commonly known as: Augmentin Stopped by: Reather Littler   coconut oil Oil Stopped by: Reather Littler   hydrOXYzine 25 MG tablet Commonly known as: ATARAX Stopped by: Reather Littler   iron polysaccharides 150 MG capsule Commonly known as: NIFEREX Stopped by: Reather Littler   magnesium oxide 400 (241.3 Mg) MG tablet Commonly known as: MAG-OX Stopped by: Reather Littler   naproxen 500 MG tablet Commonly known as: NAPROSYN Stopped by: Reather Littler       TAKE these medications    fluticasone 50 MCG/ACT nasal spray Commonly known as: FLONASE Place 1 spray into both nostrils daily as needed for allergies or rhinitis.   Junel 1/20 1-20 MG-MCG tablet Generic drug: norethindrone-ethinyl estradiol Take 1 tablet by mouth daily.   Sprintec 28 0.25-35 MG-MCG tablet Generic drug: norgestimate-ethinyl estradiol Take 1 tablet by mouth daily.   UNKNOWN TO PATIENT OTC allergy med        Allergies: No Known Allergies  Past Medical History:  Diagnosis Date  Allergy    Asthma    childhood   Complication of anesthesia    pt states after having her wisdom teeth out it took a long time for her to wake up from anesthesia    Frequent headaches    Genital warts    History of chicken pox     Past Surgical History:  Procedure Laterality Date   WISDOM TOOTH EXTRACTION      Family History  Problem Relation Age of Onset   Hypothyroidism Mother    Other Mother        pre-diabetic   Diabetes Mother    Alcohol abuse Father    Other Father        substance abuse   Arthritis Maternal Grandmother    Breast cancer Maternal Grandmother 16   Diabetes Maternal Grandmother    Dementia  Maternal Grandmother 30   Cancer Maternal Grandmother    Thyroid disease Sister    Gout Brother    Healthy Maternal Grandfather    ALS Paternal Grandmother        passed in car accident   Dementia Paternal Grandfather 68   Hypothyroidism Maternal Aunt    Hyperthyroidism Maternal Uncle    Other Sister        ?fibroids; had ablation    Social History:  reports that she quit smoking about 5 years ago. Her smoking use included cigarettes. She started smoking about 20 years ago. She has a 15 pack-year smoking history. She has never used smokeless tobacco. She reports current alcohol use. She reports current drug use. Drug: Marijuana.   Review of Systems    No history of thyroid disease.    TSH done in June 2018 was 1.8 from gynecologist   Physical Exam    Ht 5' 2.75" (1.594 m)   Wt 156 lb (70.8 kg)   BMI 27.85 kg/m     ASSESSMENT:     HYPERPROLACTINEMIA, likely idiopathic and has been asymptomatic from this Not associated with a pituitary adenoma as of 2018 Also previously estradiol level has not been low  Previously had no underlying cause or medication use and no evidence of macro prolactinemia Still does not have any menstrual irregularities and no galactorrhea  Previously her menstrual cycles had come back to normal and now on birth control pill  Mild infrequent headaches of unclear etiology, unlikely to be CNS related    PLAN:   No treatment needed for mild hyperprolactinemia She will follow-up in about a year   Reather Littler 04/14/23

## 2024-04-08 ENCOUNTER — Other Ambulatory Visit: Payer: BC Managed Care – PPO

## 2024-04-15 ENCOUNTER — Ambulatory Visit: Payer: BC Managed Care – PPO | Admitting: Endocrinology

## 2024-04-15 ENCOUNTER — Other Ambulatory Visit: Payer: BC Managed Care – PPO

## 2024-04-20 LAB — COLOGUARD: COLOGUARD: NEGATIVE

## 2024-04-23 ENCOUNTER — Encounter: Payer: Self-pay | Admitting: Endocrinology

## 2024-04-23 ENCOUNTER — Ambulatory Visit: Payer: BC Managed Care – PPO | Admitting: Endocrinology

## 2024-04-23 VITALS — BP 100/60 | HR 85 | Resp 20 | Ht 63.0 in | Wt 170.8 lb

## 2024-04-23 DIAGNOSIS — E221 Hyperprolactinemia: Secondary | ICD-10-CM

## 2024-04-23 LAB — TSH: TSH: 2.4 m[IU]/L

## 2024-04-23 LAB — PROLACTIN: Prolactin: 16.2 ng/mL

## 2024-04-23 LAB — T4, FREE: Free T4: 1.1 ng/dL (ref 0.8–1.8)

## 2024-04-23 NOTE — Progress Notes (Signed)
 Outpatient Endocrinology Note [place] Asal Teas, MD   Patient's Name: Linda Barnett    DOB: [birth year]    MRN: [id number]  REASON OF VISIT: Follow-up visit for hyperprolactinemia  PCP: Patient, No Pcp Per  HISTORY OF PRESENT ILLNESS:   Linda Barnett is a 48 y.o. old female with past medical history as listed below is presented for a follow up of hyperprolactinemia.   Pertinent History: Patient was previously seen by Dr. Von and was last time seen in July 2024.  She has been following for hyperprolactinemia.    Background : She had missed menstrual cycle in May 2018, was evaluated by gynecologist and had prolactin checked was increased to the level of 30s.  Because of her potential desire for pregnancy she was recommended for cabergoline in August 2018, she did not take cabergoline however she later had regular menstrual cycle.  She had prolactin mostly mildly elevated in the range of 30s, highest prolactin was 49.  She later become pregnant and had delivery in August 2020.  She had normal prolactin of 17 in May 2023 and 31.3 in July 2024 with high normal prolactin of 33.  She has been taking combined oral contraceptive pills.  She has not taken on dopamine agonist medication for hyperprolactinemia.  Previous studies : Macroprolactin was negative.  MRI pituitary gland in August 2018 showed no pituitary abnormality.  Thyroid  function test normal.  Interval history Patient presented for follow-up of having history of hyperprolactinemia.  Patient reports regular menstrual cycle.  She has been taking birth control pills.  She complains of low libido.  Denies galactorrhea.  No new headaches or any peripheral vision loss.  She has not been taking any mental health medication.  No other complaints today.  REVIEW OF SYSTEMS:  As per history of present illness.   PAST MEDICAL HISTORY: Past Medical History:  Diagnosis Date   Allergy    Asthma    childhood   Complication of anesthesia    pt  states after having her wisdom teeth out it took a long time for her to wake up from anesthesia    Frequent headaches    Genital warts    History of chicken pox     PAST SURGICAL HISTORY: Past Surgical History:  Procedure Laterality Date   WISDOM TOOTH EXTRACTION      ALLERGIES: No Known Allergies  FAMILY HISTORY:  Family History  Problem Relation Age of Onset   Hypothyroidism Mother    Other Mother        pre-diabetic   Diabetes Mother    Alcohol abuse Father    Other Father        substance abuse   Arthritis Maternal Grandmother    Breast cancer Maternal Grandmother 25   Diabetes Maternal Grandmother    Dementia Maternal Grandmother 63   Cancer Maternal Grandmother    Thyroid  disease Sister    Gout Brother    Healthy Maternal Grandfather    ALS Paternal Grandmother        passed in car accident   Dementia Paternal Grandfather 70   Hypothyroidism Maternal Aunt    Hyperthyroidism Maternal Uncle    Other Sister        ?fibroids; had ablation    SOCIAL HISTORY: Social History   Socioeconomic History   Marital status: Single    Spouse name: Not on file   Number of children: Not on file   Years of education: Not on file   Highest  education level: Not on file  Occupational History   Not on file  Tobacco Use   Smoking status: Former    Current packs/day: 0.00    Average packs/day: 1 pack/day for 15.0 years (15.0 ttl pk-yrs)    Types: Cigarettes    Start date: 02/12/2003    Quit date: 02/11/2018    Years since quitting: 6.2   Smokeless tobacco: Never   Tobacco comments:    on again off again in 10 years  Vaping Use   Vaping status: Never Used  Substance and Sexual Activity   Alcohol use: Yes    Comment: occasional wine   Drug use: Yes    Types: Marijuana    Comment: regular use   Sexual activity: Yes  Other Topics Concern   Not on file  Social History Narrative   Work or School: Engineering geologist      Home Situation: lives with partner -  female Psychiatric nurse)      Spiritual Beliefs:       Lifestyle: ketogenic diet, walking   Social Drivers of Corporate investment banker Strain: Not on file  Food Insecurity: Not on file  Transportation Needs: Not on file  Physical Activity: Not on file  Stress: Not on file  Social Connections: Unknown (03/14/2022)   [facility]   Social Network    Social Network: Not on file    MEDICATIONS:  Current Outpatient Medications  Medication Sig Dispense Refill   triamcinolone cream (KENALOG) 0.1 % 1 Application.     fluticasone (FLONASE) 50 MCG/ACT nasal spray Place 1 spray into both nostrils daily as needed for allergies or rhinitis.     Multiple Vitamins-Minerals (ONE-A-DAY WOMENS) tablet as directed Orally     norethindrone-ethinyl estradiol  (AUROVELA 1/20) 1-20 MG-MCG tablet 1 tablet Orally Once a day     Probiotic TBEC as directed Orally     UNKNOWN TO PATIENT OTC allergy med     No current facility-administered medications for this visit.    PHYSICAL EXAM: Vitals:   04/23/24 0843  BP: 100/60  Pulse: 85  Resp: 20  SpO2: 98%  Weight: 170 lb 12.8 oz (77.5 kg)  Height: 5' 3 (1.6 m)   Body mass index is 30.26 kg/m.  Wt Readings from Last 3 Encounters:  04/23/24 170 lb 12.8 oz (77.5 kg)  04/14/23 156 lb (70.8 kg)  02/10/22 163 lb (73.9 kg)    General: Well developed, well nourished female in no apparent distress. Appropriate for age.  HEENT: AT/[place], no external lesions. Hearing intact to the spoken word Eyes: Conjunctiva clear and no icterus. Neck: Trachea midline, neck supple Abdomen: Soft Neurologic: Alert, oriented, normal speech, no gross focal neurological deficit Extremities: No pedal pitting edema Skin: Warm, color good.  Psychiatric: Does not appear depressed or anxious  PERTINENT HISTORIC LABORATORY AND IMAGING STUDIES:  All pertinent laboratory results were reviewed. Please see HPI also for further details.    ASSESSMENT / PLAN  1.  Hyperprolactinemia (HCC)    Mild hyperprolactinemia, diagnosed in 2018, likely idiopathic.  Has been asymptomatic from this.  Not associated with pituitary adenoma, had no pituitary lesion with MRI in August 2018.  Lately has normal prolactin level.  Previously not on mental health medication.  No macro prolactin, thyroid  function test normal.  Currently on birth control pills.  Has never been on dopamine agonist/cabergoline medication.  Plan: - Check prolactin level today.  I would also like to check thyroid  function test. -  Unless prolactin level is significantly high, no indication of prolactin lowering medication.  No treatment is needed for mild hyperprolactinemia. - Will monitor prolactin annually at this time.   Diagnoses and all orders for this visit:  Hyperprolactinemia (HCC) -     TSH -     T4, free -     Prolactin    DISPOSITION Follow up in clinic in 12 months suggested.  All questions answered and patient verbalized understanding of the plan.  [place] Yeray Tomas, MD [facility] End[facility]in[facility] [address] [place], [address] [place], [place] [address] Phone # [phone]  At least part of this note was generated using voice recognition software. Inadvertent word errors may have occurred, which were not recognized during the proofreading process.

## 2024-04-24 ENCOUNTER — Ambulatory Visit: Payer: Self-pay | Admitting: Endocrinology

## 2025-04-23 ENCOUNTER — Ambulatory Visit: Admitting: Endocrinology
